# Patient Record
Sex: Female | Born: 1935
Health system: Southern US, Community
[De-identification: ages and names within clinical notes are randomized; demographics above are authoritative.]

## PROBLEM LIST (undated history)

## (undated) DIAGNOSIS — I4892 Unspecified atrial flutter: Principal | ICD-10-CM

## (undated) DIAGNOSIS — M549 Dorsalgia, unspecified: Secondary | ICD-10-CM

## (undated) DIAGNOSIS — I4891 Unspecified atrial fibrillation: Secondary | ICD-10-CM

## (undated) HISTORY — PX: BACK SURGERY: SHX140

## (undated) HISTORY — DX: Unspecified atrial fibrillation: I48.91

## (undated) HISTORY — DX: Dorsalgia, unspecified: M54.9

## (undated) HISTORY — PX: TOTAL ABDOMINAL HYSTERECTOMY W/ BILATERAL SALPINGOOPHORECTOMY: SHX83

## (undated) HISTORY — PX: BREAST BIOPSY: SHX20

## (undated) HISTORY — DX: Unspecified atrial flutter: I48.92

---

## 2008-06-18 ENCOUNTER — Encounter: Admission: RE | Admit: 2008-06-18 | Discharge: 2008-06-18 | Payer: Self-pay | Admitting: Internal Medicine

## 2008-07-02 ENCOUNTER — Encounter: Admission: RE | Admit: 2008-07-02 | Discharge: 2008-07-02 | Payer: Self-pay | Admitting: Internal Medicine

## 2008-09-14 ENCOUNTER — Encounter: Admission: RE | Admit: 2008-09-14 | Discharge: 2008-09-14 | Payer: Self-pay | Admitting: Internal Medicine

## 2008-12-21 ENCOUNTER — Encounter: Admission: RE | Admit: 2008-12-21 | Discharge: 2008-12-21 | Payer: Self-pay | Admitting: Internal Medicine

## 2009-01-04 ENCOUNTER — Encounter: Admission: RE | Admit: 2009-01-04 | Discharge: 2009-01-04 | Payer: Self-pay | Admitting: Internal Medicine

## 2009-01-27 ENCOUNTER — Encounter: Admission: RE | Admit: 2009-01-27 | Discharge: 2009-01-27 | Payer: Self-pay | Admitting: Internal Medicine

## 2011-08-29 ENCOUNTER — Encounter: Payer: Self-pay | Admitting: Physician Assistant

## 2011-08-30 ENCOUNTER — Encounter: Payer: Self-pay | Admitting: Cardiology

## 2011-08-30 ENCOUNTER — Encounter: Payer: Self-pay | Admitting: Physician Assistant

## 2011-08-30 DIAGNOSIS — I471 Supraventricular tachycardia: Secondary | ICD-10-CM

## 2011-09-08 ENCOUNTER — Encounter: Payer: Self-pay | Admitting: Cardiology

## 2011-09-11 ENCOUNTER — Encounter: Payer: Self-pay | Admitting: Cardiology

## 2011-09-11 ENCOUNTER — Ambulatory Visit (INDEPENDENT_AMBULATORY_CARE_PROVIDER_SITE_OTHER): Payer: Medicare Other | Admitting: Cardiology

## 2011-09-11 VITALS — BP 144/70 | HR 88 | Ht 62.0 in | Wt 145.4 lb

## 2011-09-11 DIAGNOSIS — I498 Other specified cardiac arrhythmias: Secondary | ICD-10-CM

## 2011-09-11 DIAGNOSIS — I4891 Unspecified atrial fibrillation: Secondary | ICD-10-CM | POA: Insufficient documentation

## 2011-09-11 DIAGNOSIS — I471 Supraventricular tachycardia: Secondary | ICD-10-CM

## 2011-09-11 NOTE — Progress Notes (Signed)
Patient ID: Meredith Bennett, female   DOB: Jul 25, 1918, 78 y.o.   MRN: [id number] PCP: Dr. Sherril Croon  78 yo presents for hospital followup.  She was admitted earlier this month with atrial fibrillation/RVR.  She felt her heart racing so went to her PCP.   She was noted to be in atrial fibrillation with RVR.  She was sent to [facility] and admitted.  She went back into NSR in the hospital after getting diltiazem.  She is in NSR today and has not noted tachypalpitations since discharge.   No definite trigger for the atrial fibrillation episode: she had been having a lot of hip pain, however, prior to the episode.  No syncope, lightheadedness, or falls.  She does have mild gait instability and has a cane.  No chest pain or DOE.  She has not started Xarelto yet as she was waiting to get it at a discount.  She continues to drive and lives alone.   ECG: NSR, normal  Labs (7/13): Creatinine 0.73, hemoglobin 15.2, BNP 104  PMH: 1. Back surgery x 2 2. Hip pain 3. TAH/BSO 4. Paroxysmal atrial fibrillation: Echo (7/13) with EF 60-65%, moderate diastolic dysfunction, mild MR, normal RV size and systolic function.  5. Carotid dopplers (7/13) were normal  SH: Lives alone, widow.  1 son in [place].  Nonsmoker.   FH: Mother died of "old age" at 75  ROS: All systems reviewed and negative except as per HPI.   Current Outpatient Prescriptions  Medication Sig Dispense Refill  . diltiazem (CARDIZEM) 120 MG tablet Take 120 mg by mouth daily.      . ergocalciferol (VITAMIN D2) 50000 UNITS capsule Take 50,000 Units by mouth once a week.      Marland Kitchen HYDROcodone-acetaminophen (VICODIN) 5-500 MG per tablet Take 1 tablet by mouth as needed.       . Multiple Vitamins-Minerals (EYE VITAMINS PO) Take 1 capsule by mouth 2 (two) times daily.      . Rivaroxaban (XARELTO) 15 MG TABS tablet Take 15 mg by mouth daily.        BP 144/70  Pulse 88  Ht 5\' 2"  (1.575 m)  Wt 145 lb 6.4 oz (65.953 kg)  BMI 26.59 kg/m2 General: NAD,  elderly Neck: No JVD, no thyromegaly or thyroid nodule.  Lungs: Clear to auscultation bilaterally with normal respiratory effort. CV: Nondisplaced PMI.  Heart regular S1/S2, no S3/S4, no murmur.  No peripheral edema.  Lower leg varicosities.  No carotid bruit.  Normal pedal pulses.  Abdomen: Soft, nontender, no hepatosplenomegaly, no distention.  Neurologic: Alert and oriented x 3.  Psych: Normal affect. Extremities: No clubbing or cyanosis.

## 2011-09-11 NOTE — Assessment & Plan Note (Signed)
Paroxysmal atrial fibrillation.  Echo with preserved EF.  She is now in NSR.  CHADSVASC = 3 (age 78, gender 1).  She will start Xarelto today (pharmacy has the discounted medication in for her now).  I will have her take 15 mg daily rather than 20 given her age.  Her creatinine is 0.73.  Continue diltiazem CD 120 mg daily.  I will have her get a BMET and CBC in 3 months and followup with me at that time.

## 2011-09-11 NOTE — Patient Instructions (Addendum)
Your physician recommends that you schedule a follow-up appointment in: 3 months with Dr. Shirlee Latch. You will receive a reminder letter in the mail in about 1-2 months reminding you to call and schedule your appointment. If you don't receive this letter, please contact our office. Your physician recommends that you continue on your current medications as directed. Please refer to the Current Medication list given to you today.  Your physician recommends that you return for lab work in: BMET & CBC in 3 months around October 22nd 2013 at [facility].

## 2011-12-04 ENCOUNTER — Encounter: Payer: Self-pay | Admitting: Cardiology

## 2011-12-04 ENCOUNTER — Ambulatory Visit (INDEPENDENT_AMBULATORY_CARE_PROVIDER_SITE_OTHER): Payer: Medicare Other | Admitting: Cardiology

## 2011-12-04 VITALS — BP 133/75 | HR 87 | Ht 62.0 in | Wt 153.0 lb

## 2011-12-04 DIAGNOSIS — I4891 Unspecified atrial fibrillation: Secondary | ICD-10-CM

## 2011-12-04 NOTE — Progress Notes (Signed)
Patient ID: Meredith Bennett, female   DOB: November 21, 1918, 78 y.o.   MRN: [id number] PCP: Dr. Sherril Croon  78 yo presents for for cardiology followup.  She was admitted in 7/13 with atrial fibrillation/RVR.  She felt her heart racing so went to her PCP.   She was noted to be in atrial fibrillation with RVR.  She was sent to [facility] and admitted.  She went back into NSR in the hospital after getting diltiazem.  She is in NSR today and has not noted tachypalpitations since discharge.  She is taking Xarelto with no problems.  No syncope, lightheadedness, or falls.  She does have mild gait instability and has a cane.  No chest pain or DOE.  She continues to drive and lives alone.  She has noted some pedal edema since starting diltiazem.  Main limitation continues to be low back and hip pain.   ECG: NSR, normal  Labs (7/13): Creatinine 0.73, hemoglobin 15.2, BNP 104  PMH: 1. Back surgery x 2 2. Hip pain 3. TAH/BSO 4. Paroxysmal atrial fibrillation: Echo (7/13) with EF 60-65%, moderate diastolic dysfunction, mild MR, normal RV size and systolic function.  5. Carotid dopplers (7/13) were normal  SH: Lives alone, widow.  1 son in [place].  Nonsmoker.   FH: Mother died of "old age" at 33  Current Outpatient Prescriptions  Medication Sig Dispense Refill  . diltiazem (CARDIZEM) 120 MG tablet Take 120 mg by mouth daily.      . ergocalciferol (VITAMIN D2) 50000 UNITS capsule Take 50,000 Units by mouth once a week.      . Multiple Vitamins-Minerals (EYE VITAMINS PO) Take 1 capsule by mouth 2 (two) times daily.      . Rivaroxaban (XARELTO) 15 MG TABS tablet Take 15 mg by mouth daily.        BP 133/75  Pulse 87  Ht 5\' 2"  (1.575 m)  Wt 153 lb (69.4 kg)  BMI 27.98 kg/m2 General: NAD, elderly Neck: No JVD, no thyromegaly or thyroid nodule.  Lungs: Very slight crackles at bases bilaterally.  CV: Nondisplaced PMI.  Heart regular S1/S2, no S3/S4, no murmur.  1+ pedal edema bilaterally.  Lower leg varicosities.   No carotid bruit.  Normal pedal pulses.  Abdomen: Soft, nontender, no hepatosplenomegaly, no distention.  Neurologic: Alert and oriented x 3.  Psych: Normal affect. Extremities: No clubbing or cyanosis.   Assessment/Plan:  1. Atrial fibrillation: She is in NSR today.  No symptomatic recurrence.  Doing well on lower dose Xarelto with no falls or bleeding episodes.  Continue Xarelto and diltiazem CD at current doses.  Check BMET/CBC.  2. Pedal edema: Possibly due to diltiazem.  It is relatively mild and does not bother her.  I offered to change her rate control med to Toprol XL but she is comfortable just continuing diltiazem for now.  If edema worsens, would switch to beta blocker.   [name] 12/04/2011 3:23 PM

## 2011-12-04 NOTE — Patient Instructions (Signed)
Continue all current medications. Your physician wants you to follow up in: 6 months.  You will receive a reminder letter in the mail one-two months in advance.  If you don't receive a letter, please call our office to schedule the follow up appointment   

## 2011-12-08 ENCOUNTER — Telehealth: Payer: Self-pay | Admitting: *Deleted

## 2011-12-08 NOTE — Telephone Encounter (Signed)
Message copied by Eustace Moore on Fri Dec 08, 2011 11:18 AM ------      Message from: Laurey Morale      Created: Wed Dec 06, 2011 10:11 AM       Labs ok

## 2011-12-08 NOTE — Telephone Encounter (Signed)
Patient informed. 

## 2012-06-14 ENCOUNTER — Ambulatory Visit: Payer: Medicare Other | Admitting: Cardiology

## 2012-07-03 ENCOUNTER — Ambulatory Visit: Payer: Medicare Other | Admitting: Cardiology

## 2012-07-22 ENCOUNTER — Encounter: Payer: Self-pay | Admitting: Cardiology

## 2012-07-22 ENCOUNTER — Ambulatory Visit (INDEPENDENT_AMBULATORY_CARE_PROVIDER_SITE_OTHER): Payer: Medicare Other | Admitting: Cardiology

## 2012-07-22 VITALS — BP 120/67 | HR 78 | Ht 62.0 in | Wt 157.0 lb

## 2012-07-22 DIAGNOSIS — Z79899 Other long term (current) drug therapy: Secondary | ICD-10-CM

## 2012-07-22 DIAGNOSIS — I4891 Unspecified atrial fibrillation: Secondary | ICD-10-CM

## 2012-07-22 NOTE — Patient Instructions (Signed)
   Lab for BMET, CBC   Office will contact with result Continue all current medications. Your physician wants you to follow up in: 6 months.  You will receive a reminder letter in the mail one-two months in advance.  If you don't receive a letter, please call our office to schedule the follow up appointment

## 2012-07-23 NOTE — Progress Notes (Signed)
Patient ID: Meredith Bennett, female   DOB: 16-Mar-1918, 79 y.o.   MRN: [id number] PCP: Dr. Sherril Croon  79 yo presents for for cardiology followup.  She was admitted in 7/13 with atrial fibrillation/RVR.  She felt her heart racing so went to her PCP.   She was noted to be in atrial fibrillation with RVR.  She was sent to [facility] and admitted.  She went back into NSR in the hospital after getting diltiazem.  She is in NSR today and has not noted tachypalpitations since discharge.  She is taking Xarelto with no problems, no obvious blood in her stool.  No syncope, lightheadedness, or falls.  She does have mild gait instability and has a cane.  No chest pain or DOE.  She continues to drive and lives alone.  She has noted some pedal edema since starting diltiazem.  Main limitation continues to be low back and hip pain.   ECG: NSR, normal  Labs (7/13): Creatinine 0.73, hemoglobin 15.2, BNP 104 Labs (10/13): K 4, creatinine 0.64, HCT 40.2  PMH: 1. Back surgery x 2 2. Hip pain 3. TAH/BSO 4. Paroxysmal atrial fibrillation: Echo (7/13) with EF 60-65%, moderate diastolic dysfunction, mild MR, normal RV size and systolic function.  5. Carotid dopplers (7/13) were normal  SH: Lives alone, widow.  1 son in [place].  Nonsmoker.   FH: Mother died of "old age" at 30  Current Outpatient Prescriptions  Medication Sig Dispense Refill  . diltiazem (CARDIZEM) 120 MG tablet Take 120 mg by mouth daily.      . ergocalciferol (VITAMIN D2) 50000 UNITS capsule Take 50,000 Units by mouth once a week.      . Multiple Vitamins-Minerals (EYE VITAMINS PO) Take 1 capsule by mouth 2 (two) times daily.      . Rivaroxaban (XARELTO) 15 MG TABS tablet Take 15 mg by mouth daily.       No current facility-administered medications for this visit.    BP 120/67  Pulse 78  Ht 5\' 2"  (1.575 m)  Wt 157 lb (71.215 kg)  BMI 28.71 kg/m2 General: NAD, elderly Neck: No JVD, no thyromegaly or thyroid nodule.  Lungs: Very slight crackles  at bases bilaterally.  CV: Nondisplaced PMI.  Heart regular S1/S2, no S3/S4, no murmur.  1+ pedal edema bilaterally.  Lower leg varicosities.  No carotid bruit.  Normal pedal pulses.  Abdomen: Soft, nontender, no hepatosplenomegaly, no distention.  Neurologic: Alert and oriented x 3.  Psych: Normal affect. Extremities: No clubbing or cyanosis.   Assessment/Plan:  1. Atrial fibrillation: She is in NSR today.  No symptomatic recurrence.  Doing well on lower dose Xarelto with no falls or bleeding episodes.  Continue Xarelto and diltiazem CD at current doses.  Check BMET/CBC.  2. Pedal edema: Possibly due to diltiazem.  It is relatively mild and does not bother her.    [name] 07/23/2012

## 2012-07-24 ENCOUNTER — Encounter: Payer: Self-pay | Admitting: *Deleted

## 2016-02-25 DIAGNOSIS — I10 Essential (primary) hypertension: Secondary | ICD-10-CM | POA: Diagnosis not present

## 2016-02-25 DIAGNOSIS — Z6826 Body mass index (BMI) 26.0-26.9, adult: Secondary | ICD-10-CM | POA: Diagnosis not present

## 2016-02-25 DIAGNOSIS — S52531A Colles' fracture of right radius, initial encounter for closed fracture: Secondary | ICD-10-CM | POA: Diagnosis not present

## 2016-02-25 DIAGNOSIS — Z713 Dietary counseling and surveillance: Secondary | ICD-10-CM | POA: Diagnosis not present

## 2016-02-25 DIAGNOSIS — W010XXA Fall on same level from slipping, tripping and stumbling without subsequent striking against object, initial encounter: Secondary | ICD-10-CM | POA: Diagnosis not present

## 2016-02-25 DIAGNOSIS — Z79899 Other long term (current) drug therapy: Secondary | ICD-10-CM | POA: Diagnosis not present

## 2016-02-25 DIAGNOSIS — Z299 Encounter for prophylactic measures, unspecified: Secondary | ICD-10-CM | POA: Diagnosis not present

## 2016-02-25 DIAGNOSIS — S52611A Displaced fracture of right ulna styloid process, initial encounter for closed fracture: Secondary | ICD-10-CM | POA: Diagnosis not present

## 2016-02-29 DIAGNOSIS — M25531 Pain in right wrist: Secondary | ICD-10-CM | POA: Diagnosis not present

## 2016-02-29 DIAGNOSIS — S52551A Other extraarticular fracture of lower end of right radius, initial encounter for closed fracture: Secondary | ICD-10-CM | POA: Diagnosis not present

## 2016-03-06 DIAGNOSIS — Z713 Dietary counseling and surveillance: Secondary | ICD-10-CM | POA: Diagnosis not present

## 2016-03-06 DIAGNOSIS — I4891 Unspecified atrial fibrillation: Secondary | ICD-10-CM | POA: Diagnosis not present

## 2016-03-06 DIAGNOSIS — Z6826 Body mass index (BMI) 26.0-26.9, adult: Secondary | ICD-10-CM | POA: Diagnosis not present

## 2016-03-06 DIAGNOSIS — Z789 Other specified health status: Secondary | ICD-10-CM | POA: Diagnosis not present

## 2016-03-06 DIAGNOSIS — Z299 Encounter for prophylactic measures, unspecified: Secondary | ICD-10-CM | POA: Diagnosis not present

## 2016-03-06 DIAGNOSIS — J069 Acute upper respiratory infection, unspecified: Secondary | ICD-10-CM | POA: Diagnosis not present

## 2016-03-06 DIAGNOSIS — M545 Low back pain: Secondary | ICD-10-CM | POA: Diagnosis not present

## 2016-03-06 DIAGNOSIS — I10 Essential (primary) hypertension: Secondary | ICD-10-CM | POA: Diagnosis not present

## 2016-03-21 DIAGNOSIS — S52551A Other extraarticular fracture of lower end of right radius, initial encounter for closed fracture: Secondary | ICD-10-CM | POA: Diagnosis not present

## 2016-04-05 DIAGNOSIS — I4891 Unspecified atrial fibrillation: Secondary | ICD-10-CM | POA: Diagnosis not present

## 2016-04-05 DIAGNOSIS — Z299 Encounter for prophylactic measures, unspecified: Secondary | ICD-10-CM | POA: Diagnosis not present

## 2016-04-05 DIAGNOSIS — I10 Essential (primary) hypertension: Secondary | ICD-10-CM | POA: Diagnosis not present

## 2016-04-05 DIAGNOSIS — R600 Localized edema: Secondary | ICD-10-CM | POA: Diagnosis not present

## 2016-04-05 DIAGNOSIS — M545 Low back pain: Secondary | ICD-10-CM | POA: Diagnosis not present

## 2016-04-05 DIAGNOSIS — Z6826 Body mass index (BMI) 26.0-26.9, adult: Secondary | ICD-10-CM | POA: Diagnosis not present

## 2016-04-07 DIAGNOSIS — S52551A Other extraarticular fracture of lower end of right radius, initial encounter for closed fracture: Secondary | ICD-10-CM | POA: Diagnosis not present

## 2016-04-12 DIAGNOSIS — M545 Low back pain: Secondary | ICD-10-CM | POA: Diagnosis not present

## 2016-04-12 DIAGNOSIS — R600 Localized edema: Secondary | ICD-10-CM | POA: Diagnosis not present

## 2016-04-12 DIAGNOSIS — H9113 Presbycusis, bilateral: Secondary | ICD-10-CM | POA: Diagnosis not present

## 2016-04-12 DIAGNOSIS — I4891 Unspecified atrial fibrillation: Secondary | ICD-10-CM | POA: Diagnosis not present

## 2016-04-12 DIAGNOSIS — H547 Unspecified visual loss: Secondary | ICD-10-CM | POA: Diagnosis not present

## 2016-04-12 DIAGNOSIS — Z9181 History of falling: Secondary | ICD-10-CM | POA: Diagnosis not present

## 2016-04-12 DIAGNOSIS — Z0000 Encounter for general adult medical examination without abnormal findings: Secondary | ICD-10-CM | POA: Diagnosis not present

## 2016-04-13 DIAGNOSIS — I4891 Unspecified atrial fibrillation: Secondary | ICD-10-CM | POA: Diagnosis not present

## 2016-04-13 DIAGNOSIS — I10 Essential (primary) hypertension: Secondary | ICD-10-CM | POA: Diagnosis not present

## 2016-05-12 DIAGNOSIS — I4891 Unspecified atrial fibrillation: Secondary | ICD-10-CM | POA: Diagnosis not present

## 2016-05-12 DIAGNOSIS — I10 Essential (primary) hypertension: Secondary | ICD-10-CM | POA: Diagnosis not present

## 2016-06-06 DIAGNOSIS — I10 Essential (primary) hypertension: Secondary | ICD-10-CM | POA: Diagnosis not present

## 2016-06-06 DIAGNOSIS — I4891 Unspecified atrial fibrillation: Secondary | ICD-10-CM | POA: Diagnosis not present

## 2016-06-06 DIAGNOSIS — K219 Gastro-esophageal reflux disease without esophagitis: Secondary | ICD-10-CM | POA: Diagnosis not present

## 2016-06-06 DIAGNOSIS — Z299 Encounter for prophylactic measures, unspecified: Secondary | ICD-10-CM | POA: Diagnosis not present

## 2016-06-06 DIAGNOSIS — Z713 Dietary counseling and surveillance: Secondary | ICD-10-CM | POA: Diagnosis not present

## 2016-06-06 DIAGNOSIS — M545 Low back pain: Secondary | ICD-10-CM | POA: Diagnosis not present

## 2016-06-06 DIAGNOSIS — M810 Age-related osteoporosis without current pathological fracture: Secondary | ICD-10-CM | POA: Diagnosis not present

## 2016-06-12 DIAGNOSIS — I10 Essential (primary) hypertension: Secondary | ICD-10-CM | POA: Diagnosis not present

## 2016-06-12 DIAGNOSIS — I4891 Unspecified atrial fibrillation: Secondary | ICD-10-CM | POA: Diagnosis not present

## 2016-07-11 DIAGNOSIS — I4891 Unspecified atrial fibrillation: Secondary | ICD-10-CM | POA: Diagnosis not present

## 2016-07-11 DIAGNOSIS — I10 Essential (primary) hypertension: Secondary | ICD-10-CM | POA: Diagnosis not present

## 2016-08-17 DIAGNOSIS — Z0000 Encounter for general adult medical examination without abnormal findings: Secondary | ICD-10-CM | POA: Diagnosis not present

## 2016-08-17 DIAGNOSIS — I4891 Unspecified atrial fibrillation: Secondary | ICD-10-CM | POA: Diagnosis not present

## 2016-08-17 DIAGNOSIS — Z1389 Encounter for screening for other disorder: Secondary | ICD-10-CM | POA: Diagnosis not present

## 2016-08-17 DIAGNOSIS — Z6825 Body mass index (BMI) 25.0-25.9, adult: Secondary | ICD-10-CM | POA: Diagnosis not present

## 2016-08-17 DIAGNOSIS — I10 Essential (primary) hypertension: Secondary | ICD-10-CM | POA: Diagnosis not present

## 2016-08-17 DIAGNOSIS — Z7189 Other specified counseling: Secondary | ICD-10-CM | POA: Diagnosis not present

## 2016-08-17 DIAGNOSIS — R5383 Other fatigue: Secondary | ICD-10-CM | POA: Diagnosis not present

## 2016-08-17 DIAGNOSIS — Z299 Encounter for prophylactic measures, unspecified: Secondary | ICD-10-CM | POA: Diagnosis not present

## 2016-08-17 DIAGNOSIS — Z79899 Other long term (current) drug therapy: Secondary | ICD-10-CM | POA: Diagnosis not present

## 2016-08-17 DIAGNOSIS — I471 Supraventricular tachycardia: Secondary | ICD-10-CM | POA: Diagnosis not present

## 2016-09-15 DIAGNOSIS — I4891 Unspecified atrial fibrillation: Secondary | ICD-10-CM | POA: Diagnosis not present

## 2016-09-15 DIAGNOSIS — I10 Essential (primary) hypertension: Secondary | ICD-10-CM | POA: Diagnosis not present

## 2016-10-16 DIAGNOSIS — H353110 Nonexudative age-related macular degeneration, right eye, stage unspecified: Secondary | ICD-10-CM | POA: Diagnosis not present

## 2016-11-07 DIAGNOSIS — I4891 Unspecified atrial fibrillation: Secondary | ICD-10-CM | POA: Diagnosis not present

## 2016-11-07 DIAGNOSIS — I10 Essential (primary) hypertension: Secondary | ICD-10-CM | POA: Diagnosis not present

## 2016-11-18 DIAGNOSIS — R69 Illness, unspecified: Secondary | ICD-10-CM | POA: Diagnosis not present

## 2016-12-12 DIAGNOSIS — J069 Acute upper respiratory infection, unspecified: Secondary | ICD-10-CM | POA: Diagnosis not present

## 2016-12-12 DIAGNOSIS — Z6824 Body mass index (BMI) 24.0-24.9, adult: Secondary | ICD-10-CM | POA: Diagnosis not present

## 2016-12-12 DIAGNOSIS — I10 Essential (primary) hypertension: Secondary | ICD-10-CM | POA: Diagnosis not present

## 2016-12-12 DIAGNOSIS — I4891 Unspecified atrial fibrillation: Secondary | ICD-10-CM | POA: Diagnosis not present

## 2016-12-12 DIAGNOSIS — Z299 Encounter for prophylactic measures, unspecified: Secondary | ICD-10-CM | POA: Diagnosis not present

## 2017-01-19 DIAGNOSIS — Z6825 Body mass index (BMI) 25.0-25.9, adult: Secondary | ICD-10-CM | POA: Diagnosis not present

## 2017-01-19 DIAGNOSIS — I4891 Unspecified atrial fibrillation: Secondary | ICD-10-CM | POA: Diagnosis not present

## 2017-01-19 DIAGNOSIS — I10 Essential (primary) hypertension: Secondary | ICD-10-CM | POA: Diagnosis not present

## 2017-01-19 DIAGNOSIS — Z299 Encounter for prophylactic measures, unspecified: Secondary | ICD-10-CM | POA: Diagnosis not present

## 2017-01-19 DIAGNOSIS — M810 Age-related osteoporosis without current pathological fracture: Secondary | ICD-10-CM | POA: Diagnosis not present

## 2017-01-19 DIAGNOSIS — J309 Allergic rhinitis, unspecified: Secondary | ICD-10-CM | POA: Diagnosis not present

## 2017-03-01 DIAGNOSIS — I4891 Unspecified atrial fibrillation: Secondary | ICD-10-CM | POA: Diagnosis not present

## 2017-03-01 DIAGNOSIS — R531 Weakness: Secondary | ICD-10-CM | POA: Diagnosis not present

## 2017-03-01 DIAGNOSIS — I10 Essential (primary) hypertension: Secondary | ICD-10-CM | POA: Diagnosis not present

## 2017-03-01 DIAGNOSIS — R000 Tachycardia, unspecified: Secondary | ICD-10-CM | POA: Diagnosis not present

## 2017-03-01 DIAGNOSIS — J42 Unspecified chronic bronchitis: Secondary | ICD-10-CM | POA: Diagnosis not present

## 2017-03-01 DIAGNOSIS — M810 Age-related osteoporosis without current pathological fracture: Secondary | ICD-10-CM | POA: Diagnosis not present

## 2017-03-01 DIAGNOSIS — Z79899 Other long term (current) drug therapy: Secondary | ICD-10-CM | POA: Diagnosis not present

## 2017-03-01 DIAGNOSIS — Z87440 Personal history of urinary (tract) infections: Secondary | ICD-10-CM | POA: Diagnosis not present

## 2017-03-01 DIAGNOSIS — Z299 Encounter for prophylactic measures, unspecified: Secondary | ICD-10-CM | POA: Diagnosis not present

## 2017-03-01 DIAGNOSIS — Z6826 Body mass index (BMI) 26.0-26.9, adult: Secondary | ICD-10-CM | POA: Diagnosis not present

## 2017-03-02 ENCOUNTER — Emergency Department (HOSPITAL_COMMUNITY)
Admission: EM | Admit: 2017-03-02 | Discharge: 2017-03-02 | Disposition: A | Discharge status: Home / Self Care | Payer: Medicare HMO | Attending: Emergency Medicine | Admitting: Emergency Medicine

## 2017-03-02 ENCOUNTER — Emergency Department (HOSPITAL_COMMUNITY): Payer: Medicare HMO

## 2017-03-02 ENCOUNTER — Encounter (HOSPITAL_COMMUNITY): Payer: Self-pay | Admitting: Emergency Medicine

## 2017-03-02 ENCOUNTER — Other Ambulatory Visit: Payer: Self-pay

## 2017-03-02 DIAGNOSIS — Z79899 Other long term (current) drug therapy: Secondary | ICD-10-CM | POA: Diagnosis not present

## 2017-03-02 DIAGNOSIS — I10 Essential (primary) hypertension: Secondary | ICD-10-CM | POA: Insufficient documentation

## 2017-03-02 DIAGNOSIS — R002 Palpitations: Secondary | ICD-10-CM | POA: Diagnosis present

## 2017-03-02 DIAGNOSIS — Z880 Allergy status to penicillin: Secondary | ICD-10-CM | POA: Diagnosis not present

## 2017-03-02 DIAGNOSIS — R000 Tachycardia, unspecified: Secondary | ICD-10-CM | POA: Diagnosis not present

## 2017-03-02 DIAGNOSIS — Z7901 Long term (current) use of anticoagulants: Secondary | ICD-10-CM | POA: Insufficient documentation

## 2017-03-02 DIAGNOSIS — R531 Weakness: Secondary | ICD-10-CM | POA: Diagnosis not present

## 2017-03-02 DIAGNOSIS — R0789 Other chest pain: Secondary | ICD-10-CM | POA: Diagnosis not present

## 2017-03-02 DIAGNOSIS — I4891 Unspecified atrial fibrillation: Secondary | ICD-10-CM | POA: Insufficient documentation

## 2017-03-02 DIAGNOSIS — Z8679 Personal history of other diseases of the circulatory system: Secondary | ICD-10-CM | POA: Insufficient documentation

## 2017-03-02 LAB — BASIC METABOLIC PANEL
Anion gap: 9 (ref 5–15)
BUN: 21 mg/dL — AB (ref 6–20)
CHLORIDE: 103 mmol/L (ref 101–111)
CO2: 26 mmol/L (ref 22–32)
Calcium: 9.5 mg/dL (ref 8.9–10.3)
Creatinine, Ser: 0.97 mg/dL (ref 0.44–1.00)
GFR calc Af Amer: 55 mL/min — ABNORMAL LOW (ref >60)
GFR calc non Af Amer: 48 mL/min — ABNORMAL LOW (ref >60)
Glucose, Bld: 172 mg/dL — ABNORMAL HIGH (ref 65–99)
POTASSIUM: 3.9 mmol/L (ref 3.5–5.1)
SODIUM: 138 mmol/L (ref 135–145)

## 2017-03-02 LAB — CBC
HEMATOCRIT: 43.4 % (ref 36.0–46.0)
HEMOGLOBIN: 14.1 g/dL (ref 12.0–15.0)
MCH: 29.3 pg (ref 26.0–34.0)
MCHC: 32.5 g/dL (ref 30.0–36.0)
MCV: 90.2 fL (ref 78.0–100.0)
Platelets: 242 10*3/uL (ref 150–400)
RBC: 4.81 MIL/uL (ref 3.87–5.11)
RDW: 13.6 % (ref 11.5–15.5)
WBC: 8.1 10*3/uL (ref 4.0–10.5)

## 2017-03-02 LAB — MAGNESIUM: MAGNESIUM: 2.1 mg/dL (ref 1.7–2.4)

## 2017-03-02 LAB — TROPONIN I: Troponin I: <0.03 ng/mL (ref <0.03)

## 2017-03-02 MED ORDER — DILTIAZEM HCL-DEXTROSE 100-5 MG/100ML-% IV SOLN (PREMIX)
5.0000 mg/h | INTRAVENOUS | Status: DC
  Filled 2017-03-02: qty 100

## 2017-03-02 MED ORDER — DILTIAZEM LOAD VIA INFUSION
5.0000 mg | Freq: Once | INTRAVENOUS | Status: DC
  Filled 2017-03-02: qty 5

## 2017-03-02 NOTE — ED Provider Notes (Signed)
[facility] EMERGENCY DEPARTMENT Provider Note   CSN: [id number] Arrival date & time: 03/02/17  1853     History   Chief Complaint Chief Complaint  Patient presents with  . Tachycardia    HPI Meredith Bennett is a 84 y.o. female.  HPI  Presents on the same day of discharge from another facility now with concern for palpitations, mild chest discomfort. Patient has a history of atrial fibrillation. She takes diltiazem. Today, after taking her regular medicines and then a second dose due to persistency of symptoms she was sent here for evaluation. Notably, the patient was admitted yesterday to a different facility after similar symptoms that began 2 days prior. She notes that overnight she received multiple IV medications, but on discharge was not sent home with any new medication. Though she is unsure of the exact medication, between her and family members description it seems as though she has been taking Cardizem for rate control. Beyond A. fib she states that she is generally healthy, active, is a former [employer]. She denies other complaints including dyspnea, syncope, fever, chills.   Past Medical History:  Diagnosis Date  . Back pain, lumbosacral   . DJD (degenerative joint disease), lumbosacral    MRI   . HTN (hypertension)   . SVT (supraventricular tachycardia) [facility])     Patient Active Problem List   Diagnosis Date Noted  . Atrial fibrillation ([place]) 09/11/2011    Past Surgical History:  Procedure Laterality Date  . BACK SURGERY     April 2011 and November 2011  . BREAST BIOPSY    . total abdominal hysterectomy and bilateral salpingo - oophorectomy      OB History    No data available       Home Medications    Prior to Admission medications   Medication Sig Start Date End Date Taking? Authorizing Provider  diltiazem (CARDIZEM) 120 MG tablet Take 120 mg by mouth daily.    [provider]  ergocalciferol (VITAMIN D2) 50000 UNITS capsule Take  50,000 Units by mouth once a week.    [provider]  Multiple Vitamins-Minerals (EYE VITAMINS PO) Take 1 capsule by mouth 2 (two) times daily.    [provider]  Rivaroxaban (XARELTO) 15 MG TABS tablet Take 15 mg by mouth daily.    [provider]    Family History No family history on file.  Social History Social History   Tobacco Use  . Smoking status: Never Smoker  . Smokeless tobacco: Never Used  Substance Use Topics  . Alcohol use: No  . Drug use: No     Allergies   Fosamax [alendronate sodium] and Penicillins   Review of Systems Review of Systems  Constitutional:       Per HPI, otherwise negative  HENT:       Per HPI, otherwise negative  Respiratory:       Per HPI, otherwise negative  Cardiovascular:       Per HPI, otherwise negative  Gastrointestinal: Negative for vomiting.  Endocrine:       Negative aside from HPI  Genitourinary:       Neg aside from HPI   Musculoskeletal:       Per HPI, otherwise negative  Skin: Negative.   Neurological: Negative for syncope.     Physical Exam Updated Vital Signs BP 114/74   Pulse (!) 160   Temp (!) 97.5 F (36.4 C) (Oral)   Resp (!) 22   Ht 5'  2" (1.575 m)   Wt 63.5 kg (140 lb)   SpO2 95%   BMI 25.61 kg/m   Physical Exam  Constitutional: She is oriented to person, place, and time. She appears well-developed and well-nourished. No distress.  HENT:  Head: Normocephalic and atraumatic.  Eyes: Conjunctivae and EOM are normal.  Cardiovascular: An irregularly irregular rhythm present. Tachycardia present.  Pulmonary/Chest: Effort normal and breath sounds normal. No stridor. No respiratory distress.  Abdominal: She exhibits no distension.  Musculoskeletal: She exhibits no edema.  Neurological: She is alert and oriented to person, place, and time. No cranial nerve deficit.  Skin: Skin is warm and dry.  Psychiatric: She has a normal mood and affect.  Nursing note and vitals  reviewed.    ED Treatments / Results  Labs (all labs ordered are listed, but only abnormal results are displayed) Labs Reviewed  BASIC METABOLIC PANEL  MAGNESIUM  CBC  TROPONIN I    EKG  EKG Interpretation  Date/Time:  Friday March 02 2017 19:12:56 EST Ventricular Rate:  159 PR Interval:    QRS Duration: 109 QT Interval:  284 QTC Calculation: 462 R Axis:   165 Text Interpretation:  Atrial fibrillation Abnormal ekg Confirmed by Carmin Muskrat [phone]) on 03/02/2017 7:47:46 PM       Radiology No results found.  Procedures Procedures (including critical care time)  Medications Ordered in ED Medications  diltiazem (CARDIZEM) 1 mg/mL load via infusion 5 mg (not administered)    And  diltiazem (CARDIZEM) 100 mg in dextrose 5% 177mL (1 mg/mL) infusion (not administered)     Initial Impression / Assessment and Plan / ED Course  I have reviewed the triage vital signs and the nursing notes.  Pertinent labs & imaging results that were available during my care of the patient were reviewed by me and considered in my medical decision making (see chart for details).    CHA2DS2/VAS Stroke Risk Points      3 >= 2 Points: High Risk  1 - 1.99 Points: Medium Risk  0 Points: Low Risk    This is the only CHA2DS2/VAS Stroke Risk Points available for the past  year.:  Change: N/A     Details    This score determines the patient's risk of having a stroke if the  patient has atrial fibrillation.       Points Metrics  0 Has Congestive Heart Failure:  No   0 Has Vascular Disease:  No   0 Has Hypertension:  No   2 Age:  30   0 Has Diabetes:  No   0 Had Stroke:  No  Had TIA:  No  Had thromboembolism:  No   1 Female:  Yes       After the initial evaluation with concern for persistent atrial fibrillation the patient will receive Cardizem IV  bolus and drip. Patient is not anticoagulated, making her not a candidate for cardioversion. In addition, the patient's symptoms seem  to have begun at least 72 hours ago.    8:39 PM Patient's A. fib has resolved, just prior to receiving her diltiazem IV. Labs unremarkable, and she states she feels better. Given his resolution, with unremarkable labs, now unremarkable vital signs, the patient will follow up with cardiology in the clinic, ambulatory clinic referral has been made.  Final Clinical Impressions(s) / ED Diagnoses  Atrial fibrillation   Carmin Muskrat, MD 03/02/17 2039

## 2017-03-02 NOTE — ED Triage Notes (Signed)
Discharged from [place] this afternoon havin been admitted for a fib  Came home felt heart racing  Came here for eval

## 2017-03-02 NOTE — Discharge Instructions (Signed)
As discussed, your evaluation today has been largely reassuring.  But, it is important that you monitor your condition carefully, and do not hesitate to return to the ED if you develop new, or concerning changes in your condition. ? ?Otherwise, please follow-up with your physician for appropriate ongoing care. ? ?

## 2017-03-05 DIAGNOSIS — Z9181 History of falling: Secondary | ICD-10-CM | POA: Diagnosis not present

## 2017-03-05 DIAGNOSIS — M810 Age-related osteoporosis without current pathological fracture: Secondary | ICD-10-CM | POA: Diagnosis not present

## 2017-03-05 DIAGNOSIS — M1990 Unspecified osteoarthritis, unspecified site: Secondary | ICD-10-CM | POA: Diagnosis not present

## 2017-03-05 DIAGNOSIS — Z79899 Other long term (current) drug therapy: Secondary | ICD-10-CM | POA: Diagnosis not present

## 2017-03-05 DIAGNOSIS — I481 Persistent atrial fibrillation: Secondary | ICD-10-CM | POA: Diagnosis not present

## 2017-03-05 DIAGNOSIS — Z87440 Personal history of urinary (tract) infections: Secondary | ICD-10-CM | POA: Diagnosis not present

## 2017-03-05 DIAGNOSIS — I10 Essential (primary) hypertension: Secondary | ICD-10-CM | POA: Diagnosis not present

## 2017-03-06 DIAGNOSIS — Z87440 Personal history of urinary (tract) infections: Secondary | ICD-10-CM | POA: Diagnosis not present

## 2017-03-06 DIAGNOSIS — I10 Essential (primary) hypertension: Secondary | ICD-10-CM | POA: Diagnosis not present

## 2017-03-06 DIAGNOSIS — I481 Persistent atrial fibrillation: Secondary | ICD-10-CM | POA: Diagnosis not present

## 2017-03-06 DIAGNOSIS — Z9181 History of falling: Secondary | ICD-10-CM | POA: Diagnosis not present

## 2017-03-06 DIAGNOSIS — M810 Age-related osteoporosis without current pathological fracture: Secondary | ICD-10-CM | POA: Diagnosis not present

## 2017-03-06 DIAGNOSIS — M1990 Unspecified osteoarthritis, unspecified site: Secondary | ICD-10-CM | POA: Diagnosis not present

## 2017-03-06 DIAGNOSIS — Z79899 Other long term (current) drug therapy: Secondary | ICD-10-CM | POA: Diagnosis not present

## 2017-03-08 DIAGNOSIS — Z79899 Other long term (current) drug therapy: Secondary | ICD-10-CM | POA: Diagnosis not present

## 2017-03-08 DIAGNOSIS — Z87440 Personal history of urinary (tract) infections: Secondary | ICD-10-CM | POA: Diagnosis not present

## 2017-03-08 DIAGNOSIS — M1990 Unspecified osteoarthritis, unspecified site: Secondary | ICD-10-CM | POA: Diagnosis not present

## 2017-03-08 DIAGNOSIS — I481 Persistent atrial fibrillation: Secondary | ICD-10-CM | POA: Diagnosis not present

## 2017-03-08 DIAGNOSIS — Z9181 History of falling: Secondary | ICD-10-CM | POA: Diagnosis not present

## 2017-03-08 DIAGNOSIS — I10 Essential (primary) hypertension: Secondary | ICD-10-CM | POA: Diagnosis not present

## 2017-03-08 DIAGNOSIS — M810 Age-related osteoporosis without current pathological fracture: Secondary | ICD-10-CM | POA: Diagnosis not present

## 2017-03-09 DIAGNOSIS — I481 Persistent atrial fibrillation: Secondary | ICD-10-CM | POA: Diagnosis not present

## 2017-03-09 DIAGNOSIS — I4891 Unspecified atrial fibrillation: Secondary | ICD-10-CM | POA: Diagnosis not present

## 2017-03-09 DIAGNOSIS — I10 Essential (primary) hypertension: Secondary | ICD-10-CM | POA: Diagnosis not present

## 2017-03-09 DIAGNOSIS — Z87440 Personal history of urinary (tract) infections: Secondary | ICD-10-CM | POA: Diagnosis not present

## 2017-03-09 DIAGNOSIS — Z299 Encounter for prophylactic measures, unspecified: Secondary | ICD-10-CM | POA: Diagnosis not present

## 2017-03-09 DIAGNOSIS — M1990 Unspecified osteoarthritis, unspecified site: Secondary | ICD-10-CM | POA: Diagnosis not present

## 2017-03-09 DIAGNOSIS — Z6825 Body mass index (BMI) 25.0-25.9, adult: Secondary | ICD-10-CM | POA: Diagnosis not present

## 2017-03-09 DIAGNOSIS — I471 Supraventricular tachycardia: Secondary | ICD-10-CM | POA: Diagnosis not present

## 2017-03-09 DIAGNOSIS — M810 Age-related osteoporosis without current pathological fracture: Secondary | ICD-10-CM | POA: Diagnosis not present

## 2017-03-09 DIAGNOSIS — Z9181 History of falling: Secondary | ICD-10-CM | POA: Diagnosis not present

## 2017-03-09 DIAGNOSIS — Z79899 Other long term (current) drug therapy: Secondary | ICD-10-CM | POA: Diagnosis not present

## 2017-03-12 DIAGNOSIS — Z87440 Personal history of urinary (tract) infections: Secondary | ICD-10-CM | POA: Diagnosis not present

## 2017-03-12 DIAGNOSIS — I481 Persistent atrial fibrillation: Secondary | ICD-10-CM | POA: Diagnosis not present

## 2017-03-12 DIAGNOSIS — I10 Essential (primary) hypertension: Secondary | ICD-10-CM | POA: Diagnosis not present

## 2017-03-12 DIAGNOSIS — Z79899 Other long term (current) drug therapy: Secondary | ICD-10-CM | POA: Diagnosis not present

## 2017-03-12 DIAGNOSIS — Z9181 History of falling: Secondary | ICD-10-CM | POA: Diagnosis not present

## 2017-03-12 DIAGNOSIS — M1990 Unspecified osteoarthritis, unspecified site: Secondary | ICD-10-CM | POA: Diagnosis not present

## 2017-03-12 DIAGNOSIS — M810 Age-related osteoporosis without current pathological fracture: Secondary | ICD-10-CM | POA: Diagnosis not present

## 2017-03-13 ENCOUNTER — Encounter: Payer: Self-pay | Admitting: *Deleted

## 2017-03-13 ENCOUNTER — Encounter: Payer: Self-pay | Admitting: Cardiology

## 2017-03-13 DIAGNOSIS — I481 Persistent atrial fibrillation: Secondary | ICD-10-CM | POA: Diagnosis not present

## 2017-03-13 DIAGNOSIS — Z87440 Personal history of urinary (tract) infections: Secondary | ICD-10-CM | POA: Diagnosis not present

## 2017-03-13 DIAGNOSIS — I10 Essential (primary) hypertension: Secondary | ICD-10-CM | POA: Diagnosis not present

## 2017-03-13 DIAGNOSIS — Z79899 Other long term (current) drug therapy: Secondary | ICD-10-CM | POA: Diagnosis not present

## 2017-03-13 DIAGNOSIS — M1990 Unspecified osteoarthritis, unspecified site: Secondary | ICD-10-CM | POA: Diagnosis not present

## 2017-03-13 DIAGNOSIS — M810 Age-related osteoporosis without current pathological fracture: Secondary | ICD-10-CM | POA: Diagnosis not present

## 2017-03-13 DIAGNOSIS — Z9181 History of falling: Secondary | ICD-10-CM | POA: Diagnosis not present

## 2017-03-13 NOTE — Progress Notes (Signed)
Cardiology Office Note  Date: 03/14/2017   ID: Meredith Bennett, DOB 1918-10-28, MRN [id number]  PCP: Glenda Chroman, MD  Consulting Cardiologist: Rozann Lesches, MD   Chief Complaint  Patient presents with  . Paroxysmal atrial fibrillation    History of Present Illness: Meredith Bennett is a 84 y.o. female referred for cardiology consultation after recent ER visit with Dr. Vanita Panda at [facility]. She was seen at that time with rapid atrial fibrillation. Reportedly, rhythm resolved spontaneously prior to management with IV diltiazem and she was discharged home with regimen including Cardizem CD. She had been seen at [facility] with reportedly similar rhythm the day prior and was managed by Dr.  Woody Seller. She was followed in the past by Dr. Aundra Dubin, last seen in 2014 with known paroxysmal atrial fibrillation. She was previously on Xarelto, although notes from Dr. Woody Seller indicate that anticoagulation was ultimately stopped due to GI bleeding.  She comes in today with a family member, states that she feels well. Since being on Cardizem CD and 180 mg daily recently, she has not had recurring palpitations.  I personally reviewed the recent ECG done during ER visit which is more consistent with an ectopic atrial tachycardia versus atypical atrial flutter.  CHADSVASC score is 3. She recalls having GI bleeding previously when on Xarelto, and is not interested in resuming anticoagulation.  Past Medical History:  Diagnosis Date  . Atrial fibrillation and flutter ([place])    Atrial flutter documented 2013  . Back pain     Past Surgical History:  Procedure Laterality Date  . BACK SURGERY     April 2011 and November 2011  . BREAST BIOPSY    . TOTAL ABDOMINAL HYSTERECTOMY W/ BILATERAL SALPINGOOPHORECTOMY      Current Outpatient Medications  Medication Sig Dispense Refill  . diltiazem (CARDIZEM CD) 180 MG 24 hr capsule Take 180 mg by mouth daily.  0  . ergocalciferol (VITAMIN D2)  50000 UNITS capsule Take 50,000 Units by mouth once a week.    . Multiple Vitamins-Minerals (EYE VITAMINS PO) Take 1 capsule by mouth 2 (two) times daily.     No current facility-administered medications for this visit.    Allergies:  Fosamax [alendronate sodium] and Penicillins   Social History: The patient  reports that  has never smoked. she has never used smokeless tobacco. She reports that she does not drink alcohol or use drugs.   Family History: The patient's family history includes Colon cancer in her father; Hypertension in her mother.   ROS:  Please see the history of present illness. Otherwise, complete review of systems is positive for hearing loss.  All other systems are reviewed and negative.   Physical Exam: VS:  BP 126/64   Pulse 83   Ht 5\' 2"  (1.575 m)   Wt 141 lb 6.4 oz (64.1 kg)   SpO2 96%   BMI 25.86 kg/m , BMI Body mass index is 25.86 kg/m.  Wt Readings from Last 3 Encounters:  03/14/17 141 lb 6.4 oz (64.1 kg)  03/02/17 140 lb (63.5 kg)  07/22/12 157 lb (71.2 kg)    General: Elderly woman, appears comfortable at rest. HEENT: Conjunctiva and lids normal, oropharynx clear. Neck: Supple, no elevated JVP or carotid bruits, no thyromegaly. Lungs: Clear to auscultation, nonlabored breathing at rest. Cardiac: Regular rate and rhythm, no S3. soft systolic murmur. Abdomen: Soft, nontender, bowel sounds present. Extremities: No pitting edema, distal pulses 2+. Skin: Warm and dry. Musculoskeletal:  No kyphosis. Neuropsychiatric: Alert and oriented x3, affect grossly appropriate.  ECG: I personally reviewed the tracing from 03/02/2017 which showed a narrow complex SVT, regular at 159 bpm, consider reentrant mechanism versus atypical flutter with 2:1 block.  Recent Labwork: 03/02/2017: BUN 21; Creatinine, Ser 0.97; Hemoglobin 14.1; Magnesium 2.1; Platelets 242; Potassium 3.9; Sodium 138   Other Studies Reviewed Today:  Echocardiogram 08/30/2011 [facility]): Mild LVH  with LVEF 16-07%, grade 2 diastolic dysfunction, mild to moderate left atrial enlargement, normal right ventricular contraction, mitral annular calcification with mild mitral regurgitation, trace tricuspid regurgitation, PASP 29 mmHg, no pericardial effusion.   Assessment and Plan:  Paroxysmal atrial fibrillation/flutter. She is in sinus rhythm today and reports no palpitations. Plan at this point will be to continue on Cardizem CD at 180 mg daily. May need further medication titration and/or use of antiarrhythmics depending on symptom frequency. As noted above, she declines resuming anticoagulation, CHADSVASC score is 3.  Current medicines were reviewed with the patient today.  No orders of the defined types were placed in this encounter.   Disposition:  Signed, Satira Sark, MD, [facility] 03/14/2017 3:04 PM    [facility] [place], [place], [place] [address] Phone: [phone]; Fax: [phone]

## 2017-03-14 ENCOUNTER — Ambulatory Visit (INDEPENDENT_AMBULATORY_CARE_PROVIDER_SITE_OTHER): Payer: Medicare HMO | Admitting: Cardiology

## 2017-03-14 ENCOUNTER — Encounter: Payer: Self-pay | Admitting: Cardiology

## 2017-03-14 ENCOUNTER — Telehealth: Payer: Self-pay

## 2017-03-14 VITALS — BP 126/64 | HR 83 | Ht 62.0 in | Wt 141.4 lb

## 2017-03-14 DIAGNOSIS — I4891 Unspecified atrial fibrillation: Secondary | ICD-10-CM | POA: Diagnosis not present

## 2017-03-14 DIAGNOSIS — M1990 Unspecified osteoarthritis, unspecified site: Secondary | ICD-10-CM | POA: Diagnosis not present

## 2017-03-14 DIAGNOSIS — I4892 Unspecified atrial flutter: Secondary | ICD-10-CM | POA: Diagnosis not present

## 2017-03-14 DIAGNOSIS — Z79899 Other long term (current) drug therapy: Secondary | ICD-10-CM | POA: Diagnosis not present

## 2017-03-14 DIAGNOSIS — I481 Persistent atrial fibrillation: Secondary | ICD-10-CM | POA: Diagnosis not present

## 2017-03-14 DIAGNOSIS — Z87440 Personal history of urinary (tract) infections: Secondary | ICD-10-CM | POA: Diagnosis not present

## 2017-03-14 DIAGNOSIS — I10 Essential (primary) hypertension: Secondary | ICD-10-CM | POA: Diagnosis not present

## 2017-03-14 DIAGNOSIS — M810 Age-related osteoporosis without current pathological fracture: Secondary | ICD-10-CM | POA: Diagnosis not present

## 2017-03-14 DIAGNOSIS — Z9181 History of falling: Secondary | ICD-10-CM | POA: Diagnosis not present

## 2017-03-14 NOTE — Telephone Encounter (Signed)
Patient notified and verbalized understanding. 

## 2017-03-14 NOTE — Telephone Encounter (Signed)
Patient states Dr. Woody Seller informed her not to drive until she saw cardiologist. Patient forgot to ask during appointment if it was ok for her to drive or not?

## 2017-03-14 NOTE — Patient Instructions (Signed)
Medication Instructions:  Your physician recommends that you continue on your current medications as directed. Please refer to the Current Medication list given to you today.  Labwork: NONE  Testing/Procedures: NONE  Follow-Up: Your physician recommends that you schedule a follow-up appointment in: 3 MONTHS WITH DR. MCDOWELL.  Any Other Special Instructions Will Be Listed Below (If Applicable).  If you need a refill on your cardiac medications before your next appointment, please call your pharmacy. 

## 2017-03-14 NOTE — Telephone Encounter (Signed)
She has not had any syncope. I would not see a specific reason to tell her not to drive related solely to paroxysmal atrial fibrillation, particularly since she is tolerating her current medication.

## 2017-03-15 DIAGNOSIS — Z79899 Other long term (current) drug therapy: Secondary | ICD-10-CM | POA: Diagnosis not present

## 2017-03-15 DIAGNOSIS — I481 Persistent atrial fibrillation: Secondary | ICD-10-CM | POA: Diagnosis not present

## 2017-03-15 DIAGNOSIS — M1990 Unspecified osteoarthritis, unspecified site: Secondary | ICD-10-CM | POA: Diagnosis not present

## 2017-03-15 DIAGNOSIS — Z9181 History of falling: Secondary | ICD-10-CM | POA: Diagnosis not present

## 2017-03-15 DIAGNOSIS — I10 Essential (primary) hypertension: Secondary | ICD-10-CM | POA: Diagnosis not present

## 2017-03-15 DIAGNOSIS — Z87440 Personal history of urinary (tract) infections: Secondary | ICD-10-CM | POA: Diagnosis not present

## 2017-03-15 DIAGNOSIS — M810 Age-related osteoporosis without current pathological fracture: Secondary | ICD-10-CM | POA: Diagnosis not present

## 2017-03-16 DIAGNOSIS — I4891 Unspecified atrial fibrillation: Secondary | ICD-10-CM | POA: Diagnosis not present

## 2017-03-16 DIAGNOSIS — Z809 Family history of malignant neoplasm, unspecified: Secondary | ICD-10-CM | POA: Diagnosis not present

## 2017-03-16 DIAGNOSIS — Z8249 Family history of ischemic heart disease and other diseases of the circulatory system: Secondary | ICD-10-CM | POA: Diagnosis not present

## 2017-04-06 DIAGNOSIS — I4891 Unspecified atrial fibrillation: Secondary | ICD-10-CM | POA: Diagnosis not present

## 2017-04-06 DIAGNOSIS — Z6825 Body mass index (BMI) 25.0-25.9, adult: Secondary | ICD-10-CM | POA: Diagnosis not present

## 2017-04-06 DIAGNOSIS — Z299 Encounter for prophylactic measures, unspecified: Secondary | ICD-10-CM | POA: Diagnosis not present

## 2017-04-06 DIAGNOSIS — I10 Essential (primary) hypertension: Secondary | ICD-10-CM | POA: Diagnosis not present

## 2017-04-06 DIAGNOSIS — Z789 Other specified health status: Secondary | ICD-10-CM | POA: Diagnosis not present

## 2017-04-09 ENCOUNTER — Encounter (HOSPITAL_COMMUNITY): Payer: Self-pay | Admitting: *Deleted

## 2017-04-09 ENCOUNTER — Emergency Department (HOSPITAL_COMMUNITY)
Admission: EM | Admit: 2017-04-09 | Discharge: 2017-04-09 | Disposition: A | Discharge status: Home / Self Care | Payer: Medicare HMO | Attending: Emergency Medicine | Admitting: Emergency Medicine

## 2017-04-09 DIAGNOSIS — R002 Palpitations: Secondary | ICD-10-CM | POA: Diagnosis present

## 2017-04-09 DIAGNOSIS — I480 Paroxysmal atrial fibrillation: Secondary | ICD-10-CM | POA: Insufficient documentation

## 2017-04-09 LAB — CBC WITH DIFFERENTIAL/PLATELET
BASOS ABS: 0 10*3/uL (ref 0.0–0.1)
BASOS PCT: 0 %
EOS ABS: 0 10*3/uL (ref 0.0–0.7)
Eosinophils Relative: 0 %
HCT: 42.7 % (ref 36.0–46.0)
HEMOGLOBIN: 13.8 g/dL (ref 12.0–15.0)
LYMPHS ABS: 1.1 10*3/uL (ref 0.7–4.0)
Lymphocytes Relative: 16 %
MCH: 29.2 pg (ref 26.0–34.0)
MCHC: 32.3 g/dL (ref 30.0–36.0)
MCV: 90.5 fL (ref 78.0–100.0)
Monocytes Absolute: 0.6 10*3/uL (ref 0.1–1.0)
Monocytes Relative: 9 %
NEUTROS PCT: 75 %
Neutro Abs: 5 10*3/uL (ref 1.7–7.7)
Platelets: 218 10*3/uL (ref 150–400)
RBC: 4.72 MIL/uL (ref 3.87–5.11)
RDW: 13.5 % (ref 11.5–15.5)
WBC: 6.8 10*3/uL (ref 4.0–10.5)

## 2017-04-09 LAB — BASIC METABOLIC PANEL
ANION GAP: 13 (ref 5–15)
BUN: 16 mg/dL (ref 6–20)
CO2: 21 mmol/L — ABNORMAL LOW (ref 22–32)
Calcium: 9.4 mg/dL (ref 8.9–10.3)
Chloride: 104 mmol/L (ref 101–111)
Creatinine, Ser: 0.78 mg/dL (ref 0.44–1.00)
GFR calc Af Amer: >60 mL/min (ref >60)
Glucose, Bld: 99 mg/dL (ref 65–99)
POTASSIUM: 4.2 mmol/L (ref 3.5–5.1)
SODIUM: 138 mmol/L (ref 135–145)

## 2017-04-09 LAB — TROPONIN I

## 2017-04-09 MED ORDER — DILTIAZEM HCL ER 240 MG PO CP24: 240.0000 mg | ORAL_CAPSULE | Freq: Every day | ORAL | 1 refills | Status: DC

## 2017-04-09 MED ORDER — DILTIAZEM HCL 30 MG PO TABS
30.0000 mg | ORAL_TABLET | Freq: Once | ORAL | Status: AC
  Administered 2017-04-09: 30 mg via ORAL
  Filled 2017-04-09: qty 1

## 2017-04-09 NOTE — ED Provider Notes (Signed)
Emergency Department Provider Note   I have reviewed the triage vital signs and the nursing notes.   HISTORY  Chief Complaint Atrial Fibrillation   HPI Meredith Bennett is a 84 y.o. female with history of atrial fibrillation on Cardizem that recently increase her dose from 160 daily to 180 mg daily a couple weeks ago.  She been doing fine for the last few days she has had a return of her palpitations.  She had episode last night that lasted a while was associated with shortness of breath so she took a one-time dose of 30 mg of diltiazem which helped it get better.  She woke up this morning and was having palpitations and rapid heart rate again and called her doctor who sent her here for further evaluation.  Outside of these episodes she has no shortness of breath, chest pain, fever, cough, urinary symptoms, rashes or other associated symptoms.  She says that she has refused anticoagulation in the past secondary to concerns for her age and fall risk.  She continues to decline this. No other associated or modifying symptoms.    Past Medical History:  Diagnosis Date  . Atrial fibrillation and flutter ([place])    Atrial flutter documented 2013  . Back pain     Patient Active Problem List   Diagnosis Date Noted  . Atrial fibrillation ([place]) 09/11/2011    Past Surgical History:  Procedure Laterality Date  . BACK SURGERY     April 2011 and November 2011  . BREAST BIOPSY    . TOTAL ABDOMINAL HYSTERECTOMY W/ BILATERAL SALPINGOOPHORECTOMY      Current Outpatient Rx  . Order #: [id number] Class: Historical Med  . [START ON 04/10/2017] Order #: [id number] Class: Print  . Order #: [id number] Class: Historical Med  . Order #: [id number] Class: Historical Med    Allergies Fosamax [alendronate sodium] and Penicillins  Family History  Problem Relation Age of Onset  . Hypertension Mother   . Colon cancer Father     Social History Social History   Tobacco Use  . Smoking status: Never Smoker    . Smokeless tobacco: Never Used  Substance Use Topics  . Alcohol use: No  . Drug use: No    Review of Systems  All other systems negative except as documented in the HPI. All pertinent positives and negatives as reviewed in the HPI. ____________________________________________   PHYSICAL EXAM:  VITAL SIGNS: ED Triage Vitals  Enc Vitals Group     BP 04/09/17 1129 (!) 151/78     Pulse Rate 04/09/17 1129 96     Resp 04/09/17 1129 18     Temp 04/09/17 1129 97.9 F (36.6 C)     Temp Source 04/09/17 1129 Oral     SpO2 04/09/17 1129 96 %     Weight 04/09/17 1125 138 lb (62.6 kg)     Height 04/09/17 1125 5\' 1"  (1.549 m)     Head Circumference --      Peak Flow --      Pain Score --      Pain Loc --      Pain Edu? --      Excl. in [place]? --     Constitutional: Alert and oriented. Well appearing and in no acute distress. Eyes: Conjunctivae are normal. PERRL. EOMI. Head: Atraumatic. Nose: No congestion/rhinnorhea. Mouth/Throat: Mucous membranes are moist.  Oropharynx non-erythematous. Neck: No stridor.  No meningeal signs.   Cardiovascular: Normal rate, regular rhythm. Good peripheral circulation. Grossly normal heart  sounds.   Respiratory: Normal respiratory effort.  No retractions. Lungs CTAB. Gastrointestinal: Soft and nontender. No distention.  Musculoskeletal: No lower extremity tenderness nor edema. No gross deformities of extremities. Neurologic:  Normal speech and language. No gross focal neurologic deficits are appreciated.  Skin:  Skin is warm, dry and intact. No rash noted.  ____________________________________________   LABS (all labs ordered are listed, but only abnormal results are displayed)  Labs Reviewed  BASIC METABOLIC PANEL - Abnormal; Notable for the following components:      Result Value   CO2 21 (*)    All other components within normal limits  CBC WITH DIFFERENTIAL/PLATELET  TROPONIN I   ____________________________________________  EKG    EKG Interpretation  Date/Time:  Monday April 09 2017 11:28:36 EST Ventricular Rate:  95 PR Interval:    QRS Duration: 85 QT Interval:  341 QTC Calculation: 429 R Axis:   95 Text Interpretation:  Sinus rhythm Right ventricular hypertrophy Confirmed by Merrily Pew [phone]) on 04/09/2017 12:06:32 PM       ____________________________________________   INITIAL IMPRESSION / ASSESSMENT AND PLAN / ED COURSE  Suspect paroxysmal atrial fibrillation as a cause for her symptoms.  She is currently in a sinus rhythm with a heart rate in the 90s and slightly elevated blood pressure.  I will discuss with cardiology about possibly increasing her diltiazem dose and following up as an outpatient.  Patient continues to refuse anticoagulation which I think is appropriate in this 84 year old competent female.  Discussed with Dr. Harrington Challenger who agrees with increzasing diltiazem and suggests 240 XR dailt. 30 given here, and will take 30 tonight at home and increase dose tomorrow. No indication for further workup or admission as she is in NSR at this time. Will fu w/ Dr. Domenic Polite regarding same.      Pertinent labs & imaging results that were available during my care of the patient were reviewed by me and considered in my medical decision making (see chart for details).  ____________________________________________  FINAL CLINICAL IMPRESSION(S) / ED DIAGNOSES  Final diagnoses:  Paroxysmal atrial fibrillation ([place])     MEDICATIONS GIVEN DURING THIS VISIT:  Medications  diltiazem (CARDIZEM) tablet 30 mg (30 mg Oral Given 04/09/17 1259)     NEW OUTPATIENT MEDICATIONS STARTED DURING THIS VISIT:  Discharge Medication List as of 04/09/2017 12:51 PM    START taking these medications   Details  diltiazem (DILACOR XR) 240 MG 24 hr capsule Take 1 capsule (240 mg total) by mouth daily., Starting Tue 04/10/2017, Print        Note:  This note was prepared with assistance of Dragon voice recognition  software. Occasional wrong-word or sound-a-like substitutions may have occurred due to the inherent limitations of voice recognition software.   Merrily Pew, MD 04/09/17 [phone]

## 2017-04-09 NOTE — ED Triage Notes (Signed)
Pt denies pain, states her heart felt fast since last night. Pt with hx of afib. Pt denies sob as well.

## 2017-04-09 NOTE — Discharge Instructions (Signed)
Take one dose of your 30 mg diltiazem tonight. Then start the 240 mg daily tomorrow and follow up with Dr. Domenic Polite as directed.

## 2017-04-11 ENCOUNTER — Telehealth: Payer: Self-pay | Admitting: Cardiology

## 2017-04-11 NOTE — Telephone Encounter (Signed)
She could try taking a Cardizem 30 mg tablet at night before bedtime as long as heart rate is not below 65.

## 2017-04-11 NOTE — Telephone Encounter (Signed)
Went to [facility] on Monday.  Stated that her Diltiazem CD was increased to 240mg  daily.  No symptoms now.  Stated that she is having issue at bedtime.  Feels heart racing at times.  Wanted to know if could be given something to take at night.  Explained to her that she does have the Diltiazem 30mg  (short acting) tabs to take as needed for heart rate greater than 140.  Stated that she would like to take something before it got that high.  Please advise on a range of numbers that is safe for her take this medication.  Explained to her that normal heart rate is between 60-100.  If she does not get a reply before end of day, to go ahead and take one tab if heart rate was over 100 and she felt symptomatic.  She verbalized understanding.

## 2017-04-11 NOTE — Telephone Encounter (Signed)
Wants to speak with Dr Domenic Polite about her AFIB.  Feels like she is going to have another spell at night. Would like to know if there is anything that can be taken at night

## 2017-04-12 ENCOUNTER — Emergency Department (HOSPITAL_COMMUNITY): Payer: Medicare HMO

## 2017-04-12 ENCOUNTER — Encounter (HOSPITAL_COMMUNITY): Payer: Self-pay | Admitting: Emergency Medicine

## 2017-04-12 ENCOUNTER — Emergency Department (HOSPITAL_COMMUNITY)
Admission: EM | Admit: 2017-04-12 | Discharge: 2017-04-12 | Disposition: A | Discharge status: Home / Self Care | Payer: Medicare HMO | Attending: Emergency Medicine | Admitting: Emergency Medicine

## 2017-04-12 DIAGNOSIS — Z79899 Other long term (current) drug therapy: Secondary | ICD-10-CM | POA: Insufficient documentation

## 2017-04-12 DIAGNOSIS — I4892 Unspecified atrial flutter: Secondary | ICD-10-CM | POA: Diagnosis not present

## 2017-04-12 DIAGNOSIS — R079 Chest pain, unspecified: Secondary | ICD-10-CM | POA: Diagnosis not present

## 2017-04-12 DIAGNOSIS — K219 Gastro-esophageal reflux disease without esophagitis: Secondary | ICD-10-CM | POA: Diagnosis not present

## 2017-04-12 DIAGNOSIS — R002 Palpitations: Secondary | ICD-10-CM | POA: Diagnosis present

## 2017-04-12 DIAGNOSIS — J9811 Atelectasis: Secondary | ICD-10-CM | POA: Diagnosis not present

## 2017-04-12 LAB — CBC
HCT: 44.7 % (ref 36.0–46.0)
Hemoglobin: 14.1 g/dL (ref 12.0–15.0)
MCH: 28.8 pg (ref 26.0–34.0)
MCHC: 31.5 g/dL (ref 30.0–36.0)
MCV: 91.2 fL (ref 78.0–100.0)
Platelets: 231 10*3/uL (ref 150–400)
RBC: 4.9 MIL/uL (ref 3.87–5.11)
RDW: 13.5 % (ref 11.5–15.5)
WBC: 11.2 10*3/uL — AB (ref 4.0–10.5)

## 2017-04-12 LAB — URINALYSIS, ROUTINE W REFLEX MICROSCOPIC
Bilirubin Urine: NEGATIVE
Glucose, UA: NEGATIVE mg/dL
HGB URINE DIPSTICK: NEGATIVE
Ketones, ur: NEGATIVE mg/dL
LEUKOCYTES UA: NEGATIVE
Nitrite: NEGATIVE
Protein, ur: NEGATIVE mg/dL
SPECIFIC GRAVITY, URINE: 1.014 (ref 1.005–1.030)
pH: 6 (ref 5.0–8.0)

## 2017-04-12 LAB — COMPREHENSIVE METABOLIC PANEL
ALBUMIN: 3.3 g/dL — AB (ref 3.5–5.0)
ALT: 12 U/L — AB (ref 14–54)
AST: 17 U/L (ref 15–41)
Alkaline Phosphatase: 106 U/L (ref 38–126)
Anion gap: 10 (ref 5–15)
BUN: 18 mg/dL (ref 6–20)
CHLORIDE: 103 mmol/L (ref 101–111)
CO2: 24 mmol/L (ref 22–32)
CREATININE: 0.7 mg/dL (ref 0.44–1.00)
Calcium: 9 mg/dL (ref 8.9–10.3)
GFR calc Af Amer: >60 mL/min (ref >60)
GLUCOSE: 107 mg/dL — AB (ref 65–99)
POTASSIUM: 4 mmol/L (ref 3.5–5.1)
SODIUM: 137 mmol/L (ref 135–145)
Total Bilirubin: 0.4 mg/dL (ref 0.3–1.2)
Total Protein: 5.9 g/dL — ABNORMAL LOW (ref 6.5–8.1)

## 2017-04-12 LAB — MAGNESIUM: MAGNESIUM: 2 mg/dL (ref 1.7–2.4)

## 2017-04-12 LAB — TROPONIN I: Troponin I: <0.03 ng/mL (ref <0.03)

## 2017-04-12 MED ORDER — DILTIAZEM HCL 25 MG/5ML IV SOLN
10.0000 mg | Freq: Once | INTRAVENOUS | Status: AC
  Administered 2017-04-12: 10 mg via INTRAVENOUS
  Filled 2017-04-12: qty 5

## 2017-04-12 MED ORDER — PANTOPRAZOLE SODIUM 20 MG PO TBEC: 20.0000 mg | DELAYED_RELEASE_TABLET | Freq: Every day | ORAL | 0 refills | Status: DC

## 2017-04-12 NOTE — ED Triage Notes (Signed)
Patient complaining of palpitations and weakness since last night.

## 2017-04-12 NOTE — Telephone Encounter (Signed)
Patient notified.  Stated she did have to go to the ED today - had another really bad episode.  Is having some reflux exasperating her atrial fib.  OV moved up to 05/18/2017 with Dr. Domenic Polite.

## 2017-04-12 NOTE — ED Notes (Signed)
Pt's HR noted to be 86. New EKG performed. Notified Dr. Lita Mains.

## 2017-04-12 NOTE — ED Provider Notes (Signed)
[facility] EMERGENCY DEPARTMENT Provider Note   CSN: [id number] Arrival date & time: 04/12/17  2130     History   Chief Complaint Chief Complaint  Patient presents with  . Palpitations    HPI Meredith Bennett is a 84 y.o. female.  HPI Patient with history of atrial fibrillation states she began having burning sensation up into her chest yesterday evening around 11 PM.  She states she began having rapid palpitations at that time.  States the burning sensation has improved though she continues to have palpitations.  Denies any shortness of breath.  Denies any new lower extremity swelling or pain. Past Medical History:  Diagnosis Date  . Atrial fibrillation and flutter ([place])    Atrial flutter documented 2013  . Back pain     Patient Active Problem List   Diagnosis Date Noted  . Atrial fibrillation ([place]) 09/11/2011    Past Surgical History:  Procedure Laterality Date  . BACK SURGERY     April 2011 and November 2011  . BREAST BIOPSY    . TOTAL ABDOMINAL HYSTERECTOMY W/ BILATERAL SALPINGOOPHORECTOMY      OB History    No data available       Home Medications    Prior to Admission medications   Medication Sig Start Date End Date Taking? Authorizing Provider  diltiazem (CARDIZEM) 30 MG tablet Take 1 tablet daily AS NEEDED FOR HEART RATE GREATER THAN 140 04/06/17  Yes [provider]  diltiazem (DILACOR XR) 240 MG 24 hr capsule Take 1 capsule (240 mg total) by mouth daily. 04/10/17  Yes Mesner, Corene Cornea, MD  ergocalciferol (VITAMIN D2) 50000 UNITS capsule Take 50,000 Units by mouth once a week.   Yes [provider]  Multiple Vitamins-Minerals (EYE VITAMINS PO) Take 1 capsule by mouth 2 (two) times daily.   Yes [provider]  pantoprazole (PROTONIX) 20 MG tablet Take 1 tablet (20 mg total) by mouth daily. 04/12/17   Julianne Rice, MD    Family History Family History  Problem Relation Age of Onset  . Hypertension Mother   . Colon cancer  Father     Social History Social History   Tobacco Use  . Smoking status: Never Smoker  . Smokeless tobacco: Never Used  Substance Use Topics  . Alcohol use: No  . Drug use: No     Allergies   Fosamax [alendronate sodium] and Penicillins   Review of Systems Review of Systems  Constitutional: Negative for chills and fever.  HENT: Negative for sore throat and trouble swallowing.   Eyes: Negative for photophobia and visual disturbance.  Respiratory: Negative for cough and shortness of breath.   Cardiovascular: Positive for chest pain. Negative for palpitations and leg swelling.  Gastrointestinal: Negative for abdominal pain, constipation, diarrhea, nausea and vomiting.  Musculoskeletal: Negative for back pain, myalgias, neck pain and neck stiffness.  Skin: Negative for rash.  Neurological: Negative for dizziness, syncope, weakness, light-headedness, numbness and headaches.  All other systems reviewed and are negative.    Physical Exam Updated Vital Signs BP 135/67   Pulse 80   Temp 98.2 F (36.8 C) (Oral)   Resp (!) 23   Ht 5\' 1"  (1.549 m)   Wt 62.6 kg (138 lb)   SpO2 100%   BMI 26.07 kg/m   Physical Exam  Constitutional: She is oriented to person, place, and time. She appears well-developed and well-nourished. No distress.  HENT:  Head: Normocephalic and atraumatic.  Mouth/Throat: Oropharynx is clear and moist.  No oropharyngeal exudate.  Eyes: EOM are normal. Pupils are equal, round, and reactive to light.  Neck: Normal range of motion. Neck supple. No JVD present.  Cardiovascular:  Tachycardia.  Irregularly irregular.  Pulmonary/Chest: Effort normal and breath sounds normal. No stridor. No respiratory distress. She has no wheezes. She has no rales. She exhibits no tenderness.  Abdominal: Soft. Bowel sounds are normal. There is no tenderness. There is no rebound and no guarding.  Musculoskeletal: Normal range of motion. She exhibits no edema or tenderness.  No  lower extremity swelling, asymmetry or tenderness.  Distal pulses are 2+.  Lymphadenopathy:    She has no cervical adenopathy.  Neurological: She is alert and oriented to person, place, and time.  Moving all extremities without focal deficit.  Sensation intact.  Skin: Skin is warm and dry. Capillary refill takes less than 2 seconds. No rash noted. She is not diaphoretic. No erythema.  Psychiatric: She has a normal mood and affect. Her behavior is normal.  Nursing note and vitals reviewed.    ED Treatments / Results  Labs (all labs ordered are listed, but only abnormal results are displayed) Labs Reviewed  CBC - Abnormal; Notable for the following components:      Result Value   WBC 11.2 (*)    All other components within normal limits  URINALYSIS, ROUTINE W REFLEX MICROSCOPIC - Abnormal; Notable for the following components:   APPearance HAZY (*)    All other components within normal limits  COMPREHENSIVE METABOLIC PANEL - Abnormal; Notable for the following components:   Glucose, Bld 107 (*)    Total Protein 5.9 (*)    Albumin 3.3 (*)    ALT 12 (*)    All other components within normal limits  MAGNESIUM  TROPONIN I    EKG  EKG Interpretation  Date/Time:  Thursday April 12 2017 08:04:12 EST Ventricular Rate:  131 PR Interval:    QRS Duration: 109 QT Interval:  319 QTC Calculation: 471 R Axis:   0 Text Interpretation:  Atrial flutter Ventricular premature complex Indeterminate axis Low voltage, precordial leads Consider right ventricular hypertrophy Confirmed by Julianne Rice [phone]) on 04/12/2017 9:01:41 AM       Radiology Dg Chest Port 1 View  Result Date: 04/12/2017 CLINICAL DATA:  84 year old female with weakness and palpitations since last night. History of atrial fibrillation. EXAM: PORTABLE CHEST 1 VIEW COMPARISON:  03/02/2017 portable chest, and earlier. FINDINGS: Moderately low lung volumes compared to [facility] chest radiographs from  January and earlier. Crowding of bilateral lung markings. No pulmonary edema suspected. No pneumothorax. No definite pleural effusion. No consolidation. Mediastinal contours remain normal. Calcified aortic atherosclerosis. Visualized tracheal air column is within normal limits. Paucity of bowel gas in the upper abdomen. No acute osseous abnormality identified. IMPRESSION: Low lung volumes with atelectasis. No other acute cardiopulmonary abnormality. Electronically Signed   By: Genevie Ann M.D.   On: 04/12/2017 08:33    Procedures Procedures (including critical care time)  Medications Ordered in ED Medications  diltiazem (CARDIZEM) injection 10 mg (10 mg Intravenous Given 04/12/17 0825)     Initial Impression / Assessment and Plan / ED Course  I have reviewed the triage vital signs and the nursing notes.  Pertinent labs & imaging results that were available during my care of the patient were reviewed by me and considered in my medical decision making (see chart for details).     Patient converted to normal sinus rhythm with single dose of  IV Cardizem.  Labs are unremarkable.  Suspect patient may be having some reflux and aspiration which could be possibly exacerbating her atrial flutter.  Will start on PPI.  Advised to sleep with the head of the bed up.  She will follow-up with her cardiologist and with her primary physician.  Return precautions have been given.  Final Clinical Impressions(s) / ED Diagnoses   Final diagnoses:  Atrial flutter with rapid ventricular response ([place])  Gastroesophageal reflux disease, esophagitis presence not specified    ED Discharge Orders        Ordered    pantoprazole (PROTONIX) 20 MG tablet  Daily     04/12/17 1213       Julianne Rice, MD 04/12/17 1215

## 2017-04-13 DIAGNOSIS — Z6825 Body mass index (BMI) 25.0-25.9, adult: Secondary | ICD-10-CM | POA: Diagnosis not present

## 2017-04-13 DIAGNOSIS — I471 Supraventricular tachycardia: Secondary | ICD-10-CM | POA: Diagnosis not present

## 2017-04-13 DIAGNOSIS — Z299 Encounter for prophylactic measures, unspecified: Secondary | ICD-10-CM | POA: Diagnosis not present

## 2017-04-13 DIAGNOSIS — I10 Essential (primary) hypertension: Secondary | ICD-10-CM | POA: Diagnosis not present

## 2017-04-13 DIAGNOSIS — I4891 Unspecified atrial fibrillation: Secondary | ICD-10-CM | POA: Diagnosis not present

## 2017-04-17 DIAGNOSIS — I4891 Unspecified atrial fibrillation: Secondary | ICD-10-CM | POA: Diagnosis not present

## 2017-04-17 DIAGNOSIS — I10 Essential (primary) hypertension: Secondary | ICD-10-CM | POA: Diagnosis not present

## 2017-04-20 DIAGNOSIS — I4891 Unspecified atrial fibrillation: Secondary | ICD-10-CM | POA: Diagnosis not present

## 2017-04-20 DIAGNOSIS — I10 Essential (primary) hypertension: Secondary | ICD-10-CM | POA: Diagnosis not present

## 2017-04-20 DIAGNOSIS — Z6825 Body mass index (BMI) 25.0-25.9, adult: Secondary | ICD-10-CM | POA: Diagnosis not present

## 2017-04-20 DIAGNOSIS — Z713 Dietary counseling and surveillance: Secondary | ICD-10-CM | POA: Diagnosis not present

## 2017-04-20 DIAGNOSIS — Z299 Encounter for prophylactic measures, unspecified: Secondary | ICD-10-CM | POA: Diagnosis not present

## 2017-05-07 DIAGNOSIS — Z789 Other specified health status: Secondary | ICD-10-CM | POA: Diagnosis not present

## 2017-05-07 DIAGNOSIS — Z299 Encounter for prophylactic measures, unspecified: Secondary | ICD-10-CM | POA: Diagnosis not present

## 2017-05-07 DIAGNOSIS — Z713 Dietary counseling and surveillance: Secondary | ICD-10-CM | POA: Diagnosis not present

## 2017-05-07 DIAGNOSIS — R600 Localized edema: Secondary | ICD-10-CM | POA: Diagnosis not present

## 2017-05-07 DIAGNOSIS — Z6826 Body mass index (BMI) 26.0-26.9, adult: Secondary | ICD-10-CM | POA: Diagnosis not present

## 2017-05-14 DIAGNOSIS — I10 Essential (primary) hypertension: Secondary | ICD-10-CM | POA: Diagnosis not present

## 2017-05-14 DIAGNOSIS — I471 Supraventricular tachycardia: Secondary | ICD-10-CM | POA: Diagnosis not present

## 2017-05-14 DIAGNOSIS — I4891 Unspecified atrial fibrillation: Secondary | ICD-10-CM | POA: Diagnosis not present

## 2017-05-14 DIAGNOSIS — Z6826 Body mass index (BMI) 26.0-26.9, adult: Secondary | ICD-10-CM | POA: Diagnosis not present

## 2017-05-14 DIAGNOSIS — R609 Edema, unspecified: Secondary | ICD-10-CM | POA: Diagnosis not present

## 2017-05-14 DIAGNOSIS — Z299 Encounter for prophylactic measures, unspecified: Secondary | ICD-10-CM | POA: Diagnosis not present

## 2017-05-17 NOTE — Progress Notes (Signed)
Cardiology Office Note  Date: 05/18/2017   ID: Meredith Bennett, DOB [birth year], MRN [id number]  PCP: Glenda Chroman, MD  Primary Cardiologist: Rozann Lesches, MD   Chief Complaint  Patient presents with  . PAF    History of Present Illness: Meredith Bennett is a 84 y.o. female that I met recently in January.  She presents to the office after recent ER visit in February at which point she had a recurrent episode of rapid atrial fibrillation that resolved spontaneously with IV Cardizem.  Since that time she was seen by her PCP and placed on low-dose Toprol-XL to take in the evenings.  She does not report any palpitations since that time and has continued on Cardizem CD.  Does have leg swelling associated with her calcium channel blocker.  She does keep her legs elevated and has been using stockings.  CHADSVASC score is 3. She recalls having GI bleeding previously when on Xarelto, and is not interested in resuming anticoagulation.  I personally reviewed her ECG today which shows sinus rhythm with PVC, R' in lead V1 and V2.  Past Medical History:  Diagnosis Date  . Atrial fibrillation and flutter ([place])    Atrial flutter documented 2013  . Back pain     Past Surgical History:  Procedure Laterality Date  . BACK SURGERY     April 2011 and November 2011  . BREAST BIOPSY    . TOTAL ABDOMINAL HYSTERECTOMY W/ BILATERAL SALPINGOOPHORECTOMY      Current Outpatient Medications  Medication Sig Dispense Refill  . diltiazem (DILACOR XR) 240 MG 24 hr capsule Take 1 capsule (240 mg total) by mouth daily. 30 capsule 1  . ergocalciferol (VITAMIN D2) 50000 UNITS capsule Take 50,000 Units by mouth once a week.    . metoprolol succinate (TOPROL-XL) 25 MG 24 hr tablet Take 1 tablet by mouth every evening. At 5:00 pm  4  . Multiple Vitamins-Minerals (EYE VITAMINS PO) Take 1 capsule by mouth 2 (two) times daily.     No current facility-administered medications for this visit.    Allergies:   Fosamax [alendronate sodium] and Penicillins   Social History: The patient  reports that she has never smoked. She has never used smokeless tobacco. She reports that she does not drink alcohol or use drugs.   ROS:  Please see the history of present illness. Otherwise, complete review of systems is positive for hearing loss.  All other systems are reviewed and negative.   Physical Exam: VS:  BP (!) 106/55   Pulse 67   Ht 5' 2"  (1.575 m)   Wt 147 lb (66.7 kg)   SpO2 98%   BMI 26.89 kg/m , BMI Body mass index is 26.89 kg/m.  Wt Readings from Last 3 Encounters:  05/18/17 147 lb (66.7 kg)  04/12/17 138 lb (62.6 kg)  04/09/17 138 lb (62.6 kg)    General: Elderly woman, appears comfortable at rest. HEENT: Conjunctiva and lids normal, oropharynx clear. Neck: Supple, no elevated JVP or carotid bruits, no thyromegaly. Lungs: Clear to auscultation, nonlabored breathing at rest. Cardiac: Regular rate and rhythm, no S3, soft systolic murmur. Abdomen: Soft, nontender, bowel sounds present. Extremities: Mild lower leg edema, distal pulses 2+. Skin: Warm and dry. Musculoskeletal: No kyphosis. Neuropsychiatric: Alert and oriented x3, affect grossly appropriate.  ECG: I personally reviewed the tracing from 04/12/2017 which showed atrial fibrillation with rapid ventricular response.  Recent Labwork: 04/12/2017: ALT 12; AST 17; BUN 18; Creatinine, Ser 0.70; Hemoglobin  14.1; Magnesium 2.0; Platelets 231; Potassium 4.0; Sodium 137   Other Studies Reviewed Today:  Echocardiogram 08/30/2011 [facility]): Mild LVH with LVEF 24-29%, grade 2 diastolic dysfunction, mild to moderate left atrial enlargement, normal right ventricular contraction, mitral annular calcification with mild mitral regurgitation, trace tricuspid regurgitation, PASP 29 mmHg, no pericardial effusion.   Assessment and Plan:  Paroxysmal atrial fibrillation/flutter.  She is in sinus rhythm today.  I reviewed her current regimen which  now includes low-dose Toprol-XL in the evening with Cardizem CD 240 mg in the morning.  She reports good control of symptoms on this combination of medications and we will continue with current plan.  As noted previously she declines anticoagulation.  Her CHADSVASC score is 3.  If breakthrough arrhythmia continues, may need to consider addition of antiarrhythmic therapy.  Current medicines were reviewed with the patient today.   Orders Placed This Encounter  Procedures  . EKG 12-Lead    Disposition: Follow-up in 3 months.  Signed, Satira Sark, MD, [facility] 05/18/2017 2:07 PM    [facility] [place], [place], [place] [address] Phone: [phone]; Fax: [phone]

## 2017-05-18 ENCOUNTER — Ambulatory Visit: Payer: Medicare HMO | Admitting: Cardiology

## 2017-05-18 ENCOUNTER — Encounter: Payer: Self-pay | Admitting: Cardiology

## 2017-05-18 VITALS — BP 106/55 | HR 67 | Ht 62.0 in | Wt 147.0 lb

## 2017-05-18 DIAGNOSIS — I4891 Unspecified atrial fibrillation: Secondary | ICD-10-CM

## 2017-05-18 DIAGNOSIS — I4892 Unspecified atrial flutter: Secondary | ICD-10-CM | POA: Diagnosis not present

## 2017-05-18 NOTE — Patient Instructions (Signed)
Medication Instructions:   Your physician recommends that you continue on your current medications as directed. Please refer to the Current Medication list given to you today.  Labwork:  NONE  Testing/Procedures:  NONE  Follow-Up:  Your physician recommends that you schedule a follow-up appointment in: 3 months.  Any Other Special Instructions Will Be Listed Below (If Applicable).  If you need a refill on your cardiac medications before your next appointment, please call your pharmacy. 

## 2017-06-01 DIAGNOSIS — I4891 Unspecified atrial fibrillation: Secondary | ICD-10-CM | POA: Diagnosis not present

## 2017-06-01 DIAGNOSIS — Z299 Encounter for prophylactic measures, unspecified: Secondary | ICD-10-CM | POA: Diagnosis not present

## 2017-06-01 DIAGNOSIS — R600 Localized edema: Secondary | ICD-10-CM | POA: Diagnosis not present

## 2017-06-01 DIAGNOSIS — I471 Supraventricular tachycardia: Secondary | ICD-10-CM | POA: Diagnosis not present

## 2017-06-01 DIAGNOSIS — Z6826 Body mass index (BMI) 26.0-26.9, adult: Secondary | ICD-10-CM | POA: Diagnosis not present

## 2017-06-01 DIAGNOSIS — I10 Essential (primary) hypertension: Secondary | ICD-10-CM | POA: Diagnosis not present

## 2017-06-07 DIAGNOSIS — Z299 Encounter for prophylactic measures, unspecified: Secondary | ICD-10-CM | POA: Diagnosis not present

## 2017-06-07 DIAGNOSIS — Z79899 Other long term (current) drug therapy: Secondary | ICD-10-CM | POA: Diagnosis not present

## 2017-06-07 DIAGNOSIS — I10 Essential (primary) hypertension: Secondary | ICD-10-CM | POA: Diagnosis not present

## 2017-06-07 DIAGNOSIS — I4891 Unspecified atrial fibrillation: Secondary | ICD-10-CM | POA: Diagnosis not present

## 2017-06-07 DIAGNOSIS — Z6826 Body mass index (BMI) 26.0-26.9, adult: Secondary | ICD-10-CM | POA: Diagnosis not present

## 2017-06-07 DIAGNOSIS — R600 Localized edema: Secondary | ICD-10-CM | POA: Diagnosis not present

## 2017-06-26 ENCOUNTER — Ambulatory Visit: Payer: Medicare HMO | Admitting: Cardiology

## 2017-07-03 DIAGNOSIS — I4891 Unspecified atrial fibrillation: Secondary | ICD-10-CM | POA: Diagnosis not present

## 2017-07-03 DIAGNOSIS — I10 Essential (primary) hypertension: Secondary | ICD-10-CM | POA: Diagnosis not present

## 2017-07-05 DIAGNOSIS — I4891 Unspecified atrial fibrillation: Secondary | ICD-10-CM | POA: Diagnosis not present

## 2017-07-05 DIAGNOSIS — Z299 Encounter for prophylactic measures, unspecified: Secondary | ICD-10-CM | POA: Diagnosis not present

## 2017-07-05 DIAGNOSIS — I10 Essential (primary) hypertension: Secondary | ICD-10-CM | POA: Diagnosis not present

## 2017-07-05 DIAGNOSIS — R600 Localized edema: Secondary | ICD-10-CM | POA: Diagnosis not present

## 2017-07-05 DIAGNOSIS — Z6826 Body mass index (BMI) 26.0-26.9, adult: Secondary | ICD-10-CM | POA: Diagnosis not present

## 2017-07-05 DIAGNOSIS — I471 Supraventricular tachycardia: Secondary | ICD-10-CM | POA: Diagnosis not present

## 2017-08-21 DIAGNOSIS — Z6826 Body mass index (BMI) 26.0-26.9, adult: Secondary | ICD-10-CM | POA: Diagnosis not present

## 2017-08-21 DIAGNOSIS — Z1339 Encounter for screening examination for other mental health and behavioral disorders: Secondary | ICD-10-CM | POA: Diagnosis not present

## 2017-08-21 DIAGNOSIS — I10 Essential (primary) hypertension: Secondary | ICD-10-CM | POA: Diagnosis not present

## 2017-08-21 DIAGNOSIS — Z7189 Other specified counseling: Secondary | ICD-10-CM | POA: Diagnosis not present

## 2017-08-21 DIAGNOSIS — R5383 Other fatigue: Secondary | ICD-10-CM | POA: Diagnosis not present

## 2017-08-21 DIAGNOSIS — Z1331 Encounter for screening for depression: Secondary | ICD-10-CM | POA: Diagnosis not present

## 2017-08-21 DIAGNOSIS — Z0000 Encounter for general adult medical examination without abnormal findings: Secondary | ICD-10-CM | POA: Diagnosis not present

## 2017-08-21 DIAGNOSIS — I471 Supraventricular tachycardia: Secondary | ICD-10-CM | POA: Diagnosis not present

## 2017-08-21 DIAGNOSIS — Z299 Encounter for prophylactic measures, unspecified: Secondary | ICD-10-CM | POA: Diagnosis not present

## 2017-08-21 DIAGNOSIS — I4891 Unspecified atrial fibrillation: Secondary | ICD-10-CM | POA: Diagnosis not present

## 2017-08-22 DIAGNOSIS — Z79899 Other long term (current) drug therapy: Secondary | ICD-10-CM | POA: Diagnosis not present

## 2017-08-22 DIAGNOSIS — R5383 Other fatigue: Secondary | ICD-10-CM | POA: Diagnosis not present

## 2017-08-22 DIAGNOSIS — Z0000 Encounter for general adult medical examination without abnormal findings: Secondary | ICD-10-CM | POA: Diagnosis not present

## 2017-08-22 NOTE — Progress Notes (Signed)
Cardiology Office Note  Date: 08/24/2017   ID: Meredith Bennett, DOB 05/08/1918, MRN [id number]  PCP: Glenda Chroman, MD  Primary Cardiologist: Rozann Lesches, MD   Chief Complaint  Patient presents with  . Atrial Fibrillation    History of Present Illness: Meredith Bennett is a 84 y.o. female last seen in March.  She is here for a follow-up visit.  She states that she feels well overall, has not had any significant palpitations on combination of Toprol-XL and Cardizem CD.  She does not report any falls or syncope.  Uses a cane.  CHADSVASC score is 3.She recalls having GI bleeding previously when on Xarelto, and is not interested in resuming anticoagulation.  Past Medical History:  Diagnosis Date  . Atrial fibrillation and flutter ([place])    Atrial flutter documented 2013  . Back pain     Past Surgical History:  Procedure Laterality Date  . BACK SURGERY     April 2011 and November 2011  . BREAST BIOPSY    . TOTAL ABDOMINAL HYSTERECTOMY W/ BILATERAL SALPINGOOPHORECTOMY      Current Outpatient Medications  Medication Sig Dispense Refill  . diltiazem (DILACOR XR) 240 MG 24 hr capsule Take 1 capsule (240 mg total) by mouth daily. 30 capsule 1  . ergocalciferol (VITAMIN D2) 50000 UNITS capsule Take 50,000 Units by mouth once a week.    . furosemide (LASIX) 40 MG tablet Take 20 mg by mouth as needed.   1  . metoprolol succinate (TOPROL-XL) 25 MG 24 hr tablet Take 1 tablet by mouth every evening. At 5:00 pm  4  . Multiple Vitamins-Minerals (EYE VITAMINS PO) Take 1 capsule by mouth 2 (two) times daily.    . potassium chloride (K-DUR) 10 MEQ tablet Take 10 mEq by mouth as needed.  2   No current facility-administered medications for this visit.    Allergies:  Fosamax [alendronate sodium] and Penicillins   Social History: The patient  reports that she has never smoked. She has never used smokeless tobacco. She reports that she does not drink alcohol or use drugs.   ROS:  Please  see the history of present illness. Otherwise, complete review of systems is positive for hearing loss.  All other systems are reviewed and negative.   Physical Exam: VS:  BP (!) 113/55   Pulse 74   Ht 5\' 2"  (1.575 m)   Wt 150 lb (68 kg)   SpO2 98%   BMI 27.44 kg/m , BMI Body mass index is 27.44 kg/m.  Wt Readings from Last 3 Encounters:  08/24/17 150 lb (68 kg)  05/18/17 147 lb (66.7 kg)  04/12/17 138 lb (62.6 kg)    General: Elderly woman, appears comfortable at rest. HEENT: Conjunctiva and lids normal, oropharynx clear. Neck: Supple, no elevated JVP or carotid bruits, no thyromegaly. Lungs: Clear to auscultation, nonlabored breathing at rest. Cardiac: Regular rate and rhythm, no S3, soft systolic murmur. Abdomen: Soft, nontender, bowel sounds present. Extremities: Mild ankle edema, distal pulses 2+.  ECG: I personally reviewed the tracing from 05/18/2017 which showed sinus rhythm with PVC, R' in lead V1 and V2.  Recent Labwork: 04/12/2017: ALT 12; AST 17; BUN 18; Creatinine, Ser 0.70; Hemoglobin 14.1; Magnesium 2.0; Platelets 231; Potassium 4.0; Sodium 137   Other Studies Reviewed Today:  Echocardiogram7/11/2011 ([facility]): Mild LVH with LVEF 36-62%, grade 2 diastolic dysfunction, mild to moderate left atrial enlargement, normal right ventricular contraction, mitralannularcalcification with mild mitral regurgitation, trace tricuspid regurgitation, PASP  29 mmHg, no pericardial effusion.  Assessment and Plan:  Paroxysmal atrial fibrillation and flutter.  She is doing well symptomatically speaking without significant palpitations on current regimen including Toprol-XL and Cardizem CD.  CHADSVASC score is 3, however she declines anticoagulation as noted above recalling previous bleeding problems on Xarelto.  Current medicines were reviewed with the patient today.  Disposition: Follow-up in 6 months.  Signed, Satira Sark, MD, [facility] 08/24/2017 2:00 PM    [place] [facility] [place], [place], [place] [address] Phone: [phone]; Fax: [phone]

## 2017-08-24 ENCOUNTER — Ambulatory Visit: Payer: Medicare HMO | Admitting: Cardiology

## 2017-08-24 ENCOUNTER — Other Ambulatory Visit: Payer: Self-pay

## 2017-08-24 ENCOUNTER — Encounter: Payer: Self-pay | Admitting: Cardiology

## 2017-08-24 VITALS — BP 113/55 | HR 74 | Ht 62.0 in | Wt 150.0 lb

## 2017-08-24 DIAGNOSIS — I4892 Unspecified atrial flutter: Secondary | ICD-10-CM | POA: Diagnosis not present

## 2017-08-24 DIAGNOSIS — I4891 Unspecified atrial fibrillation: Secondary | ICD-10-CM | POA: Diagnosis not present

## 2017-08-24 NOTE — Patient Instructions (Signed)

## 2017-09-12 DIAGNOSIS — I10 Essential (primary) hypertension: Secondary | ICD-10-CM | POA: Diagnosis not present

## 2017-09-12 DIAGNOSIS — I4891 Unspecified atrial fibrillation: Secondary | ICD-10-CM | POA: Diagnosis not present

## 2017-10-23 DIAGNOSIS — Z6826 Body mass index (BMI) 26.0-26.9, adult: Secondary | ICD-10-CM | POA: Diagnosis not present

## 2017-10-23 DIAGNOSIS — I4891 Unspecified atrial fibrillation: Secondary | ICD-10-CM | POA: Diagnosis not present

## 2017-10-23 DIAGNOSIS — R600 Localized edema: Secondary | ICD-10-CM | POA: Diagnosis not present

## 2017-10-23 DIAGNOSIS — I471 Supraventricular tachycardia: Secondary | ICD-10-CM | POA: Diagnosis not present

## 2017-10-23 DIAGNOSIS — Z299 Encounter for prophylactic measures, unspecified: Secondary | ICD-10-CM | POA: Diagnosis not present

## 2017-10-23 DIAGNOSIS — I10 Essential (primary) hypertension: Secondary | ICD-10-CM | POA: Diagnosis not present

## 2017-10-30 DIAGNOSIS — D3131 Benign neoplasm of right choroid: Secondary | ICD-10-CM | POA: Diagnosis not present

## 2017-10-30 DIAGNOSIS — H524 Presbyopia: Secondary | ICD-10-CM | POA: Diagnosis not present

## 2017-11-14 DIAGNOSIS — R69 Illness, unspecified: Secondary | ICD-10-CM | POA: Diagnosis not present

## 2017-12-17 DIAGNOSIS — Z299 Encounter for prophylactic measures, unspecified: Secondary | ICD-10-CM | POA: Diagnosis not present

## 2017-12-17 DIAGNOSIS — I10 Essential (primary) hypertension: Secondary | ICD-10-CM | POA: Diagnosis not present

## 2017-12-17 DIAGNOSIS — K219 Gastro-esophageal reflux disease without esophagitis: Secondary | ICD-10-CM | POA: Diagnosis not present

## 2017-12-17 DIAGNOSIS — Z6827 Body mass index (BMI) 27.0-27.9, adult: Secondary | ICD-10-CM | POA: Diagnosis not present

## 2017-12-17 DIAGNOSIS — J069 Acute upper respiratory infection, unspecified: Secondary | ICD-10-CM | POA: Diagnosis not present

## 2017-12-17 DIAGNOSIS — R110 Nausea: Secondary | ICD-10-CM | POA: Diagnosis not present

## 2018-01-16 DIAGNOSIS — I10 Essential (primary) hypertension: Secondary | ICD-10-CM | POA: Diagnosis not present

## 2018-01-16 DIAGNOSIS — I4891 Unspecified atrial fibrillation: Secondary | ICD-10-CM | POA: Diagnosis not present

## 2018-02-15 DIAGNOSIS — Z299 Encounter for prophylactic measures, unspecified: Secondary | ICD-10-CM | POA: Diagnosis not present

## 2018-02-15 DIAGNOSIS — K5900 Constipation, unspecified: Secondary | ICD-10-CM | POA: Diagnosis not present

## 2018-02-15 DIAGNOSIS — I4891 Unspecified atrial fibrillation: Secondary | ICD-10-CM | POA: Diagnosis not present

## 2018-02-15 DIAGNOSIS — Z6826 Body mass index (BMI) 26.0-26.9, adult: Secondary | ICD-10-CM | POA: Diagnosis not present

## 2018-02-15 DIAGNOSIS — I10 Essential (primary) hypertension: Secondary | ICD-10-CM | POA: Diagnosis not present

## 2018-02-18 DIAGNOSIS — I4891 Unspecified atrial fibrillation: Secondary | ICD-10-CM | POA: Diagnosis not present

## 2018-02-18 DIAGNOSIS — I10 Essential (primary) hypertension: Secondary | ICD-10-CM | POA: Diagnosis not present

## 2018-02-28 ENCOUNTER — Ambulatory Visit: Payer: Medicare HMO | Admitting: Cardiology

## 2018-02-28 ENCOUNTER — Encounter: Payer: Self-pay | Admitting: Cardiology

## 2018-02-28 VITALS — BP 112/60 | HR 70 | Ht 62.0 in | Wt 158.0 lb

## 2018-02-28 DIAGNOSIS — I4892 Unspecified atrial flutter: Secondary | ICD-10-CM | POA: Diagnosis not present

## 2018-02-28 DIAGNOSIS — I4891 Unspecified atrial fibrillation: Secondary | ICD-10-CM

## 2018-02-28 NOTE — Progress Notes (Signed)
Cardiology Office Note  Date: 02/28/2018   ID: Meredith Bennett, DOB Jul 25, 1918, MRN [id number]  PCP: Glenda Chroman, MD  Primary Cardiologist: Rozann Lesches, MD   Chief Complaint  Patient presents with  . Atrial Fibrillation    History of Present Illness: Meredith Bennett is a 85 y.o. female last seen in July 2019.  She presents for a routine follow-up visit.  She does not report any palpitations or chest pain.  Remarkably, she remains fairly functional in terms of basic ADLs, uses a cane, denies any recent falls.  She continues to follow with Dr. Woody Seller.  CHADSVASC score is 3.She recalls having GI bleeding previously when on Xarelto, and is not interested in resuming anticoagulation.  She remains on Dilacor XR and Toprol-XL.  Heart rate is regular today and her previous ECG from last year showed sinus rhythm.  Past Medical History:  Diagnosis Date  . Atrial fibrillation and flutter ([place])    Atrial flutter documented 2013  . Back pain     Past Surgical History:  Procedure Laterality Date  . BACK SURGERY     April 2011 and November 2011  . BREAST BIOPSY    . TOTAL ABDOMINAL HYSTERECTOMY W/ BILATERAL SALPINGOOPHORECTOMY      Current Outpatient Medications  Medication Sig Dispense Refill  . diltiazem (DILACOR XR) 240 MG 24 hr capsule Take 1 capsule (240 mg total) by mouth daily. 30 capsule 1  . ergocalciferol (VITAMIN D2) 50000 UNITS capsule Take 50,000 Units by mouth once a week.    . metoprolol succinate (TOPROL-XL) 25 MG 24 hr tablet Take 1 tablet by mouth every evening. At 5:00 pm  4  . Multiple Vitamins-Minerals (EYE VITAMINS PO) Take 1 capsule by mouth 2 (two) times daily.     No current facility-administered medications for this visit.    Allergies:  Fosamax [alendronate sodium] and Penicillins   Social History: The patient  reports that she has never smoked. She has never used smokeless tobacco. She reports that she does not drink alcohol or use drugs.   ROS:   Please see the history of present illness. Otherwise, complete review of systems is positive for hearing loss.  All other systems are reviewed and negative.   Physical Exam: VS:  BP 112/60   Pulse 70   Ht 5\' 2"  (1.575 m)   Wt 158 lb (71.7 kg)   SpO2 96%   BMI 28.90 kg/m , BMI Body mass index is 28.9 kg/m.  Wt Readings from Last 3 Encounters:  02/28/18 158 lb (71.7 kg)  08/24/17 150 lb (68 kg)  05/18/17 147 lb (66.7 kg)    General: Elderly woman, appears comfortable at rest.  Using a cane. HEENT: Conjunctiva and lids normal, oropharynx clear. Neck: Supple, no elevated JVP or carotid bruits, no thyromegaly. Lungs: Clear to auscultation, nonlabored breathing at rest. Cardiac: Regular rate and rhythm, no S3, soft systolic murmur. Abdomen: Soft, nontender, bowel sounds present. Extremities: No pitting edema, distal pulses 2+.  ECG: I personally reviewed the tracing from 05/18/2017 which showed sinus rhythm with PVC, R' in lead V1 and V2.  Recent Labwork: 04/12/2017: ALT 12; AST 17; BUN 18; Creatinine, Ser 0.70; Hemoglobin 14.1; Magnesium 2.0; Platelets 231; Potassium 4.0; Sodium 137   Other Studies Reviewed Today:  Echocardiogram7/11/2011 ([facility]): Mild LVH with LVEF 75-91%, grade 2 diastolic dysfunction, mild to moderate left atrial enlargement, normal right ventricular contraction, mitralannularcalcification with mild mitral regurgitation, trace tricuspid regurgitation, PASP 29 mmHg, no pericardial  effusion.  Assessment and Plan:  Paroxysmal atrial fibrillation and flutter.  Patient declines anticoagulation as noted above with prior history of bleeding on Xarelto.  CHADSVASC score is 3. She is tolerating Dilacor XR and Toprol-XL without significant palpitations and has been in sinus rhythm on the last few evaluations.  Keep follow-up with Dr. Woody Seller.  Current medicines were reviewed with the patient today.  Disposition: Follow-up in 1 year.  Signed, Satira Sark, MD,  [facility] 02/28/2018 2:12 PM    [place] at [place], [place], [place] [address] Phone: [phone]; Fax: [phone]

## 2018-02-28 NOTE — Patient Instructions (Signed)

## 2018-03-18 DIAGNOSIS — I10 Essential (primary) hypertension: Secondary | ICD-10-CM | POA: Diagnosis not present

## 2018-03-18 DIAGNOSIS — I4891 Unspecified atrial fibrillation: Secondary | ICD-10-CM | POA: Diagnosis not present

## 2018-04-23 DIAGNOSIS — I4891 Unspecified atrial fibrillation: Secondary | ICD-10-CM | POA: Diagnosis not present

## 2018-04-23 DIAGNOSIS — I10 Essential (primary) hypertension: Secondary | ICD-10-CM | POA: Diagnosis not present

## 2018-05-17 DIAGNOSIS — Z299 Encounter for prophylactic measures, unspecified: Secondary | ICD-10-CM | POA: Diagnosis not present

## 2018-05-17 DIAGNOSIS — I4891 Unspecified atrial fibrillation: Secondary | ICD-10-CM | POA: Diagnosis not present

## 2018-05-17 DIAGNOSIS — Z6826 Body mass index (BMI) 26.0-26.9, adult: Secondary | ICD-10-CM | POA: Diagnosis not present

## 2018-05-17 DIAGNOSIS — I10 Essential (primary) hypertension: Secondary | ICD-10-CM | POA: Diagnosis not present

## 2018-05-17 DIAGNOSIS — I471 Supraventricular tachycardia: Secondary | ICD-10-CM | POA: Diagnosis not present

## 2018-06-06 DIAGNOSIS — I4891 Unspecified atrial fibrillation: Secondary | ICD-10-CM | POA: Diagnosis not present

## 2018-06-06 DIAGNOSIS — I10 Essential (primary) hypertension: Secondary | ICD-10-CM | POA: Diagnosis not present

## 2018-06-27 DIAGNOSIS — M545 Low back pain: Secondary | ICD-10-CM | POA: Diagnosis not present

## 2018-06-27 DIAGNOSIS — M810 Age-related osteoporosis without current pathological fracture: Secondary | ICD-10-CM | POA: Diagnosis not present

## 2018-06-27 DIAGNOSIS — Z6826 Body mass index (BMI) 26.0-26.9, adult: Secondary | ICD-10-CM | POA: Diagnosis not present

## 2018-06-27 DIAGNOSIS — I10 Essential (primary) hypertension: Secondary | ICD-10-CM | POA: Diagnosis not present

## 2018-06-27 DIAGNOSIS — Z299 Encounter for prophylactic measures, unspecified: Secondary | ICD-10-CM | POA: Diagnosis not present

## 2018-06-27 DIAGNOSIS — I4891 Unspecified atrial fibrillation: Secondary | ICD-10-CM | POA: Diagnosis not present

## 2018-06-28 DIAGNOSIS — Z299 Encounter for prophylactic measures, unspecified: Secondary | ICD-10-CM | POA: Diagnosis not present

## 2018-06-28 DIAGNOSIS — I4891 Unspecified atrial fibrillation: Secondary | ICD-10-CM | POA: Diagnosis not present

## 2018-06-28 DIAGNOSIS — M160 Bilateral primary osteoarthritis of hip: Secondary | ICD-10-CM | POA: Diagnosis not present

## 2018-06-28 DIAGNOSIS — I10 Essential (primary) hypertension: Secondary | ICD-10-CM | POA: Diagnosis not present

## 2018-06-28 DIAGNOSIS — M4805 Spinal stenosis, thoracolumbar region: Secondary | ICD-10-CM | POA: Diagnosis not present

## 2018-06-28 DIAGNOSIS — M47816 Spondylosis without myelopathy or radiculopathy, lumbar region: Secondary | ICD-10-CM | POA: Diagnosis not present

## 2018-06-28 DIAGNOSIS — M810 Age-related osteoporosis without current pathological fracture: Secondary | ICD-10-CM | POA: Diagnosis not present

## 2018-06-28 DIAGNOSIS — Z981 Arthrodesis status: Secondary | ICD-10-CM | POA: Diagnosis not present

## 2018-06-28 DIAGNOSIS — Z6826 Body mass index (BMI) 26.0-26.9, adult: Secondary | ICD-10-CM | POA: Diagnosis not present

## 2018-06-28 DIAGNOSIS — M461 Sacroiliitis, not elsewhere classified: Secondary | ICD-10-CM | POA: Diagnosis not present

## 2018-07-08 DIAGNOSIS — I4891 Unspecified atrial fibrillation: Secondary | ICD-10-CM | POA: Diagnosis not present

## 2018-07-08 DIAGNOSIS — I10 Essential (primary) hypertension: Secondary | ICD-10-CM | POA: Diagnosis not present

## 2018-08-05 DIAGNOSIS — R69 Illness, unspecified: Secondary | ICD-10-CM | POA: Diagnosis not present

## 2018-08-20 DIAGNOSIS — Z299 Encounter for prophylactic measures, unspecified: Secondary | ICD-10-CM | POA: Diagnosis not present

## 2018-08-20 DIAGNOSIS — I4891 Unspecified atrial fibrillation: Secondary | ICD-10-CM | POA: Diagnosis not present

## 2018-08-20 DIAGNOSIS — Z6825 Body mass index (BMI) 25.0-25.9, adult: Secondary | ICD-10-CM | POA: Diagnosis not present

## 2018-08-20 DIAGNOSIS — Z789 Other specified health status: Secondary | ICD-10-CM | POA: Diagnosis not present

## 2018-08-20 DIAGNOSIS — I471 Supraventricular tachycardia: Secondary | ICD-10-CM | POA: Diagnosis not present

## 2018-08-20 DIAGNOSIS — M461 Sacroiliitis, not elsewhere classified: Secondary | ICD-10-CM | POA: Diagnosis not present

## 2018-08-20 DIAGNOSIS — I10 Essential (primary) hypertension: Secondary | ICD-10-CM | POA: Diagnosis not present

## 2018-08-26 DIAGNOSIS — Z7189 Other specified counseling: Secondary | ICD-10-CM | POA: Diagnosis not present

## 2018-08-26 DIAGNOSIS — E7800 Pure hypercholesterolemia, unspecified: Secondary | ICD-10-CM | POA: Diagnosis not present

## 2018-08-26 DIAGNOSIS — Z6825 Body mass index (BMI) 25.0-25.9, adult: Secondary | ICD-10-CM | POA: Diagnosis not present

## 2018-08-26 DIAGNOSIS — I10 Essential (primary) hypertension: Secondary | ICD-10-CM | POA: Diagnosis not present

## 2018-08-26 DIAGNOSIS — Z0000 Encounter for general adult medical examination without abnormal findings: Secondary | ICD-10-CM | POA: Diagnosis not present

## 2018-08-26 DIAGNOSIS — Z1339 Encounter for screening examination for other mental health and behavioral disorders: Secondary | ICD-10-CM | POA: Diagnosis not present

## 2018-08-26 DIAGNOSIS — Z1211 Encounter for screening for malignant neoplasm of colon: Secondary | ICD-10-CM | POA: Diagnosis not present

## 2018-08-26 DIAGNOSIS — R5383 Other fatigue: Secondary | ICD-10-CM | POA: Diagnosis not present

## 2018-08-26 DIAGNOSIS — Z79899 Other long term (current) drug therapy: Secondary | ICD-10-CM | POA: Diagnosis not present

## 2018-08-26 DIAGNOSIS — Z1331 Encounter for screening for depression: Secondary | ICD-10-CM | POA: Diagnosis not present

## 2018-08-26 DIAGNOSIS — Z299 Encounter for prophylactic measures, unspecified: Secondary | ICD-10-CM | POA: Diagnosis not present

## 2018-08-28 DIAGNOSIS — R69 Illness, unspecified: Secondary | ICD-10-CM | POA: Diagnosis not present

## 2018-09-05 DIAGNOSIS — I10 Essential (primary) hypertension: Secondary | ICD-10-CM | POA: Diagnosis not present

## 2018-09-05 DIAGNOSIS — I4891 Unspecified atrial fibrillation: Secondary | ICD-10-CM | POA: Diagnosis not present

## 2018-09-05 DIAGNOSIS — Z299 Encounter for prophylactic measures, unspecified: Secondary | ICD-10-CM | POA: Diagnosis not present

## 2018-09-05 DIAGNOSIS — R609 Edema, unspecified: Secondary | ICD-10-CM | POA: Diagnosis not present

## 2018-09-05 DIAGNOSIS — Z6825 Body mass index (BMI) 25.0-25.9, adult: Secondary | ICD-10-CM | POA: Diagnosis not present

## 2018-09-05 DIAGNOSIS — Z87891 Personal history of nicotine dependence: Secondary | ICD-10-CM | POA: Diagnosis not present

## 2018-09-05 DIAGNOSIS — M461 Sacroiliitis, not elsewhere classified: Secondary | ICD-10-CM | POA: Diagnosis not present

## 2018-09-05 DIAGNOSIS — I471 Supraventricular tachycardia: Secondary | ICD-10-CM | POA: Diagnosis not present

## 2018-09-09 DIAGNOSIS — R5383 Other fatigue: Secondary | ICD-10-CM | POA: Diagnosis not present

## 2018-09-11 DIAGNOSIS — I4891 Unspecified atrial fibrillation: Secondary | ICD-10-CM | POA: Diagnosis not present

## 2018-09-11 DIAGNOSIS — I10 Essential (primary) hypertension: Secondary | ICD-10-CM | POA: Diagnosis not present

## 2018-10-29 DIAGNOSIS — R600 Localized edema: Secondary | ICD-10-CM | POA: Diagnosis not present

## 2018-10-29 DIAGNOSIS — Z299 Encounter for prophylactic measures, unspecified: Secondary | ICD-10-CM | POA: Diagnosis not present

## 2018-10-29 DIAGNOSIS — I471 Supraventricular tachycardia: Secondary | ICD-10-CM | POA: Diagnosis not present

## 2018-10-29 DIAGNOSIS — E7800 Pure hypercholesterolemia, unspecified: Secondary | ICD-10-CM | POA: Diagnosis not present

## 2018-10-29 DIAGNOSIS — I10 Essential (primary) hypertension: Secondary | ICD-10-CM | POA: Diagnosis not present

## 2018-10-29 DIAGNOSIS — I4891 Unspecified atrial fibrillation: Secondary | ICD-10-CM | POA: Diagnosis not present

## 2018-10-29 DIAGNOSIS — Z6826 Body mass index (BMI) 26.0-26.9, adult: Secondary | ICD-10-CM | POA: Diagnosis not present

## 2018-11-04 DIAGNOSIS — R69 Illness, unspecified: Secondary | ICD-10-CM | POA: Diagnosis not present

## 2018-12-05 DIAGNOSIS — I10 Essential (primary) hypertension: Secondary | ICD-10-CM | POA: Diagnosis not present

## 2018-12-05 DIAGNOSIS — M810 Age-related osteoporosis without current pathological fracture: Secondary | ICD-10-CM | POA: Diagnosis not present

## 2018-12-05 DIAGNOSIS — Z299 Encounter for prophylactic measures, unspecified: Secondary | ICD-10-CM | POA: Diagnosis not present

## 2018-12-05 DIAGNOSIS — R609 Edema, unspecified: Secondary | ICD-10-CM | POA: Diagnosis not present

## 2018-12-05 DIAGNOSIS — Z6826 Body mass index (BMI) 26.0-26.9, adult: Secondary | ICD-10-CM | POA: Diagnosis not present

## 2018-12-12 DIAGNOSIS — I471 Supraventricular tachycardia: Secondary | ICD-10-CM | POA: Diagnosis not present

## 2018-12-12 DIAGNOSIS — I10 Essential (primary) hypertension: Secondary | ICD-10-CM | POA: Diagnosis not present

## 2018-12-12 DIAGNOSIS — R609 Edema, unspecified: Secondary | ICD-10-CM | POA: Diagnosis not present

## 2018-12-12 DIAGNOSIS — N183 Chronic kidney disease, stage 3 unspecified: Secondary | ICD-10-CM | POA: Diagnosis not present

## 2018-12-12 DIAGNOSIS — I4891 Unspecified atrial fibrillation: Secondary | ICD-10-CM | POA: Diagnosis not present

## 2018-12-12 DIAGNOSIS — Z299 Encounter for prophylactic measures, unspecified: Secondary | ICD-10-CM | POA: Diagnosis not present

## 2018-12-12 DIAGNOSIS — Z6827 Body mass index (BMI) 27.0-27.9, adult: Secondary | ICD-10-CM | POA: Diagnosis not present

## 2018-12-17 DIAGNOSIS — I10 Essential (primary) hypertension: Secondary | ICD-10-CM | POA: Diagnosis not present

## 2018-12-17 DIAGNOSIS — Z6827 Body mass index (BMI) 27.0-27.9, adult: Secondary | ICD-10-CM | POA: Diagnosis not present

## 2018-12-17 DIAGNOSIS — R29898 Other symptoms and signs involving the musculoskeletal system: Secondary | ICD-10-CM | POA: Diagnosis not present

## 2018-12-17 DIAGNOSIS — Z299 Encounter for prophylactic measures, unspecified: Secondary | ICD-10-CM | POA: Diagnosis not present

## 2018-12-17 DIAGNOSIS — I4891 Unspecified atrial fibrillation: Secondary | ICD-10-CM | POA: Diagnosis not present

## 2018-12-17 DIAGNOSIS — R600 Localized edema: Secondary | ICD-10-CM | POA: Diagnosis not present

## 2018-12-27 DIAGNOSIS — Z6827 Body mass index (BMI) 27.0-27.9, adult: Secondary | ICD-10-CM | POA: Diagnosis not present

## 2018-12-27 DIAGNOSIS — I10 Essential (primary) hypertension: Secondary | ICD-10-CM | POA: Diagnosis not present

## 2018-12-27 DIAGNOSIS — I4891 Unspecified atrial fibrillation: Secondary | ICD-10-CM | POA: Diagnosis not present

## 2018-12-27 DIAGNOSIS — I471 Supraventricular tachycardia: Secondary | ICD-10-CM | POA: Diagnosis not present

## 2018-12-27 DIAGNOSIS — Z299 Encounter for prophylactic measures, unspecified: Secondary | ICD-10-CM | POA: Diagnosis not present

## 2018-12-27 DIAGNOSIS — R600 Localized edema: Secondary | ICD-10-CM | POA: Diagnosis not present

## 2018-12-30 DIAGNOSIS — I10 Essential (primary) hypertension: Secondary | ICD-10-CM | POA: Diagnosis not present

## 2018-12-30 DIAGNOSIS — I4891 Unspecified atrial fibrillation: Secondary | ICD-10-CM | POA: Diagnosis not present

## 2019-01-10 DIAGNOSIS — R609 Edema, unspecified: Secondary | ICD-10-CM | POA: Diagnosis not present

## 2019-01-10 DIAGNOSIS — I4891 Unspecified atrial fibrillation: Secondary | ICD-10-CM | POA: Diagnosis not present

## 2019-01-10 DIAGNOSIS — I471 Supraventricular tachycardia: Secondary | ICD-10-CM | POA: Diagnosis not present

## 2019-01-10 DIAGNOSIS — Z299 Encounter for prophylactic measures, unspecified: Secondary | ICD-10-CM | POA: Diagnosis not present

## 2019-01-10 DIAGNOSIS — Z6827 Body mass index (BMI) 27.0-27.9, adult: Secondary | ICD-10-CM | POA: Diagnosis not present

## 2019-01-10 DIAGNOSIS — I10 Essential (primary) hypertension: Secondary | ICD-10-CM | POA: Diagnosis not present

## 2019-01-13 DIAGNOSIS — R600 Localized edema: Secondary | ICD-10-CM | POA: Diagnosis not present

## 2019-01-28 DIAGNOSIS — K219 Gastro-esophageal reflux disease without esophagitis: Secondary | ICD-10-CM | POA: Diagnosis not present

## 2019-01-28 DIAGNOSIS — Z743 Need for continuous supervision: Secondary | ICD-10-CM | POA: Diagnosis not present

## 2019-01-28 DIAGNOSIS — W19XXXA Unspecified fall, initial encounter: Secondary | ICD-10-CM | POA: Diagnosis not present

## 2019-01-28 DIAGNOSIS — S0003XA Contusion of scalp, initial encounter: Secondary | ICD-10-CM | POA: Diagnosis not present

## 2019-01-28 DIAGNOSIS — E876 Hypokalemia: Secondary | ICD-10-CM | POA: Diagnosis not present

## 2019-01-28 DIAGNOSIS — J90 Pleural effusion, not elsewhere classified: Secondary | ICD-10-CM | POA: Diagnosis not present

## 2019-01-28 DIAGNOSIS — R262 Difficulty in walking, not elsewhere classified: Secondary | ICD-10-CM | POA: Diagnosis not present

## 2019-01-28 DIAGNOSIS — N390 Urinary tract infection, site not specified: Secondary | ICD-10-CM | POA: Diagnosis not present

## 2019-01-28 DIAGNOSIS — I5033 Acute on chronic diastolic (congestive) heart failure: Secondary | ICD-10-CM | POA: Diagnosis not present

## 2019-01-28 DIAGNOSIS — I509 Heart failure, unspecified: Secondary | ICD-10-CM | POA: Diagnosis not present

## 2019-01-28 DIAGNOSIS — I471 Supraventricular tachycardia: Secondary | ICD-10-CM | POA: Diagnosis not present

## 2019-01-28 DIAGNOSIS — S3993XA Unspecified injury of pelvis, initial encounter: Secondary | ICD-10-CM | POA: Diagnosis not present

## 2019-01-28 DIAGNOSIS — I480 Paroxysmal atrial fibrillation: Secondary | ICD-10-CM | POA: Diagnosis not present

## 2019-01-28 DIAGNOSIS — I4891 Unspecified atrial fibrillation: Secondary | ICD-10-CM | POA: Diagnosis not present

## 2019-01-28 DIAGNOSIS — M545 Low back pain: Secondary | ICD-10-CM | POA: Diagnosis not present

## 2019-01-28 DIAGNOSIS — R52 Pain, unspecified: Secondary | ICD-10-CM | POA: Diagnosis not present

## 2019-01-28 DIAGNOSIS — J9601 Acute respiratory failure with hypoxia: Secondary | ICD-10-CM | POA: Diagnosis not present

## 2019-01-28 DIAGNOSIS — U071 COVID-19: Secondary | ICD-10-CM | POA: Diagnosis not present

## 2019-01-28 DIAGNOSIS — J189 Pneumonia, unspecified organism: Secondary | ICD-10-CM | POA: Diagnosis not present

## 2019-01-28 DIAGNOSIS — M2681 Anterior soft tissue impingement: Secondary | ICD-10-CM | POA: Diagnosis not present

## 2019-01-28 DIAGNOSIS — J1289 Other viral pneumonia: Secondary | ICD-10-CM | POA: Diagnosis not present

## 2019-01-28 DIAGNOSIS — I110 Hypertensive heart disease with heart failure: Secondary | ICD-10-CM | POA: Diagnosis not present

## 2019-01-28 DIAGNOSIS — M1990 Unspecified osteoarthritis, unspecified site: Secondary | ICD-10-CM | POA: Diagnosis not present

## 2019-01-28 DIAGNOSIS — E871 Hypo-osmolality and hyponatremia: Secondary | ICD-10-CM | POA: Diagnosis not present

## 2019-01-28 DIAGNOSIS — I10 Essential (primary) hypertension: Secondary | ICD-10-CM | POA: Diagnosis not present

## 2019-01-28 DIAGNOSIS — I5031 Acute diastolic (congestive) heart failure: Secondary | ICD-10-CM | POA: Diagnosis not present

## 2019-01-28 DIAGNOSIS — R0902 Hypoxemia: Secondary | ICD-10-CM | POA: Diagnosis not present

## 2019-01-28 DIAGNOSIS — Z209 Contact with and (suspected) exposure to unspecified communicable disease: Secondary | ICD-10-CM | POA: Diagnosis not present

## 2019-01-28 DIAGNOSIS — R000 Tachycardia, unspecified: Secondary | ICD-10-CM | POA: Diagnosis not present

## 2019-02-05 DIAGNOSIS — I5033 Acute on chronic diastolic (congestive) heart failure: Secondary | ICD-10-CM | POA: Diagnosis not present

## 2019-02-05 DIAGNOSIS — U071 COVID-19: Secondary | ICD-10-CM | POA: Diagnosis not present

## 2019-02-05 DIAGNOSIS — M2681 Anterior soft tissue impingement: Secondary | ICD-10-CM | POA: Diagnosis not present

## 2019-02-05 DIAGNOSIS — J9601 Acute respiratory failure with hypoxia: Secondary | ICD-10-CM | POA: Diagnosis not present

## 2019-02-05 DIAGNOSIS — Z743 Need for continuous supervision: Secondary | ICD-10-CM | POA: Diagnosis not present

## 2019-02-05 DIAGNOSIS — K219 Gastro-esophageal reflux disease without esophagitis: Secondary | ICD-10-CM | POA: Diagnosis not present

## 2019-02-05 DIAGNOSIS — J1289 Other viral pneumonia: Secondary | ICD-10-CM | POA: Diagnosis not present

## 2019-02-05 DIAGNOSIS — N390 Urinary tract infection, site not specified: Secondary | ICD-10-CM | POA: Diagnosis not present

## 2019-02-05 DIAGNOSIS — D519 Vitamin B12 deficiency anemia, unspecified: Secondary | ICD-10-CM | POA: Diagnosis not present

## 2019-02-05 DIAGNOSIS — M1990 Unspecified osteoarthritis, unspecified site: Secondary | ICD-10-CM | POA: Diagnosis not present

## 2019-02-05 DIAGNOSIS — J189 Pneumonia, unspecified organism: Secondary | ICD-10-CM | POA: Diagnosis not present

## 2019-02-05 DIAGNOSIS — I4891 Unspecified atrial fibrillation: Secondary | ICD-10-CM | POA: Diagnosis not present

## 2019-02-05 DIAGNOSIS — D649 Anemia, unspecified: Secondary | ICD-10-CM | POA: Diagnosis not present

## 2019-02-05 DIAGNOSIS — R262 Difficulty in walking, not elsewhere classified: Secondary | ICD-10-CM | POA: Diagnosis not present

## 2019-02-05 DIAGNOSIS — E876 Hypokalemia: Secondary | ICD-10-CM | POA: Diagnosis not present

## 2019-02-05 DIAGNOSIS — I10 Essential (primary) hypertension: Secondary | ICD-10-CM | POA: Diagnosis not present

## 2019-02-05 DIAGNOSIS — R6889 Other general symptoms and signs: Secondary | ICD-10-CM | POA: Diagnosis not present

## 2019-02-05 DIAGNOSIS — Z209 Contact with and (suspected) exposure to unspecified communicable disease: Secondary | ICD-10-CM | POA: Diagnosis not present

## 2019-02-06 DIAGNOSIS — I10 Essential (primary) hypertension: Secondary | ICD-10-CM | POA: Diagnosis not present

## 2019-02-06 DIAGNOSIS — I4891 Unspecified atrial fibrillation: Secondary | ICD-10-CM | POA: Diagnosis not present

## 2019-02-06 DIAGNOSIS — U071 COVID-19: Secondary | ICD-10-CM | POA: Diagnosis not present

## 2019-02-06 DIAGNOSIS — I5033 Acute on chronic diastolic (congestive) heart failure: Secondary | ICD-10-CM | POA: Diagnosis not present

## 2019-03-01 DIAGNOSIS — I5033 Acute on chronic diastolic (congestive) heart failure: Secondary | ICD-10-CM | POA: Diagnosis not present

## 2019-03-01 DIAGNOSIS — U071 COVID-19: Secondary | ICD-10-CM | POA: Diagnosis not present

## 2019-03-03 DIAGNOSIS — J189 Pneumonia, unspecified organism: Secondary | ICD-10-CM | POA: Diagnosis not present

## 2019-03-03 DIAGNOSIS — U071 COVID-19: Secondary | ICD-10-CM | POA: Diagnosis not present

## 2019-03-03 DIAGNOSIS — M810 Age-related osteoporosis without current pathological fracture: Secondary | ICD-10-CM | POA: Diagnosis not present

## 2019-03-03 DIAGNOSIS — I5033 Acute on chronic diastolic (congestive) heart failure: Secondary | ICD-10-CM | POA: Diagnosis not present

## 2019-03-03 DIAGNOSIS — I110 Hypertensive heart disease with heart failure: Secondary | ICD-10-CM | POA: Diagnosis not present

## 2019-03-03 DIAGNOSIS — I051 Rheumatic mitral insufficiency: Secondary | ICD-10-CM | POA: Diagnosis not present

## 2019-03-03 DIAGNOSIS — Z9981 Dependence on supplemental oxygen: Secondary | ICD-10-CM | POA: Diagnosis not present

## 2019-03-03 DIAGNOSIS — I4891 Unspecified atrial fibrillation: Secondary | ICD-10-CM | POA: Diagnosis not present

## 2019-03-03 DIAGNOSIS — K219 Gastro-esophageal reflux disease without esophagitis: Secondary | ICD-10-CM | POA: Diagnosis not present

## 2019-03-04 DIAGNOSIS — Z6827 Body mass index (BMI) 27.0-27.9, adult: Secondary | ICD-10-CM | POA: Diagnosis not present

## 2019-03-04 DIAGNOSIS — I110 Hypertensive heart disease with heart failure: Secondary | ICD-10-CM | POA: Diagnosis not present

## 2019-03-04 DIAGNOSIS — Z9981 Dependence on supplemental oxygen: Secondary | ICD-10-CM | POA: Diagnosis not present

## 2019-03-04 DIAGNOSIS — I4891 Unspecified atrial fibrillation: Secondary | ICD-10-CM | POA: Diagnosis not present

## 2019-03-04 DIAGNOSIS — J189 Pneumonia, unspecified organism: Secondary | ICD-10-CM | POA: Diagnosis not present

## 2019-03-04 DIAGNOSIS — M810 Age-related osteoporosis without current pathological fracture: Secondary | ICD-10-CM | POA: Diagnosis not present

## 2019-03-04 DIAGNOSIS — J9611 Chronic respiratory failure with hypoxia: Secondary | ICD-10-CM | POA: Diagnosis not present

## 2019-03-04 DIAGNOSIS — I471 Supraventricular tachycardia: Secondary | ICD-10-CM | POA: Diagnosis not present

## 2019-03-04 DIAGNOSIS — U071 COVID-19: Secondary | ICD-10-CM | POA: Diagnosis not present

## 2019-03-04 DIAGNOSIS — K219 Gastro-esophageal reflux disease without esophagitis: Secondary | ICD-10-CM | POA: Diagnosis not present

## 2019-03-04 DIAGNOSIS — I5033 Acute on chronic diastolic (congestive) heart failure: Secondary | ICD-10-CM | POA: Diagnosis not present

## 2019-03-04 DIAGNOSIS — Z299 Encounter for prophylactic measures, unspecified: Secondary | ICD-10-CM | POA: Diagnosis not present

## 2019-03-04 DIAGNOSIS — I051 Rheumatic mitral insufficiency: Secondary | ICD-10-CM | POA: Diagnosis not present

## 2019-03-05 DIAGNOSIS — M810 Age-related osteoporosis without current pathological fracture: Secondary | ICD-10-CM | POA: Diagnosis not present

## 2019-03-05 DIAGNOSIS — U071 COVID-19: Secondary | ICD-10-CM | POA: Diagnosis not present

## 2019-03-05 DIAGNOSIS — I5033 Acute on chronic diastolic (congestive) heart failure: Secondary | ICD-10-CM | POA: Diagnosis not present

## 2019-03-05 DIAGNOSIS — I110 Hypertensive heart disease with heart failure: Secondary | ICD-10-CM | POA: Diagnosis not present

## 2019-03-05 DIAGNOSIS — I4891 Unspecified atrial fibrillation: Secondary | ICD-10-CM | POA: Diagnosis not present

## 2019-03-05 DIAGNOSIS — K219 Gastro-esophageal reflux disease without esophagitis: Secondary | ICD-10-CM | POA: Diagnosis not present

## 2019-03-05 DIAGNOSIS — Z9981 Dependence on supplemental oxygen: Secondary | ICD-10-CM | POA: Diagnosis not present

## 2019-03-05 DIAGNOSIS — I051 Rheumatic mitral insufficiency: Secondary | ICD-10-CM | POA: Diagnosis not present

## 2019-03-05 DIAGNOSIS — J189 Pneumonia, unspecified organism: Secondary | ICD-10-CM | POA: Diagnosis not present

## 2019-03-06 ENCOUNTER — Encounter: Payer: Self-pay | Admitting: Cardiology

## 2019-03-06 ENCOUNTER — Telehealth: Payer: Self-pay | Admitting: Cardiology

## 2019-03-06 DIAGNOSIS — K219 Gastro-esophageal reflux disease without esophagitis: Secondary | ICD-10-CM | POA: Diagnosis not present

## 2019-03-06 DIAGNOSIS — I5033 Acute on chronic diastolic (congestive) heart failure: Secondary | ICD-10-CM | POA: Diagnosis not present

## 2019-03-06 DIAGNOSIS — U071 COVID-19: Secondary | ICD-10-CM | POA: Diagnosis not present

## 2019-03-06 DIAGNOSIS — Z9981 Dependence on supplemental oxygen: Secondary | ICD-10-CM | POA: Diagnosis not present

## 2019-03-06 DIAGNOSIS — J189 Pneumonia, unspecified organism: Secondary | ICD-10-CM | POA: Diagnosis not present

## 2019-03-06 DIAGNOSIS — I051 Rheumatic mitral insufficiency: Secondary | ICD-10-CM | POA: Diagnosis not present

## 2019-03-06 DIAGNOSIS — I4891 Unspecified atrial fibrillation: Secondary | ICD-10-CM | POA: Diagnosis not present

## 2019-03-06 DIAGNOSIS — I110 Hypertensive heart disease with heart failure: Secondary | ICD-10-CM | POA: Diagnosis not present

## 2019-03-06 DIAGNOSIS — M810 Age-related osteoporosis without current pathological fracture: Secondary | ICD-10-CM | POA: Diagnosis not present

## 2019-03-06 NOTE — Telephone Encounter (Signed)

## 2019-03-08 NOTE — Progress Notes (Signed)
Virtual Visit via Video Note   This visit type was conducted due to national recommendations for restrictions regarding the COVID-19 Pandemic (e.g. social distancing) in an effort to limit this patient's exposure and mitigate transmission in our community.  Due to her co-morbid illnesses, this patient is at least at moderate risk for complications without adequate follow up.  This format is felt to be most appropriate for this patient at this time.  All issues noted in this document were discussed and addressed.  A limited physical exam was performed with this format.  Please refer to the patient's chart for her consent to telehealth for [facility].   Date:  03/10/2019   ID:  Unk Pinto, DOB 1919/02/14, MRN [id number]  Patient Location: Home Provider Location: Office  PCP:  Glenda Chroman, MD  Cardiologist:  Rozann Lesches, MD Electrophysiologist:  None   Evaluation Performed:  Follow-Up Visit  Chief Complaint:   Cardiac follow-up  History of Present Illness:    Meredith Bennett is a 86 y.o. female last seen in January 2020.  We communicated by video conferencing today.  She was at her home with her only son and also a granddaughter present.  She is very hard of hearing, I communicated to her through her granddaughter.  She was admitted to [facility] back in December 2020 with shortness of breath in association with hypoxic respiratory failure and rapid atrial fibrillation.  She was diagnosed with COVID-19 pneumonia and medically managed.  She was discharged to the [facility], ultimately came back home and has been slowly improving.  Appetite reportedly getting better.  She uses a walker, no recent falls.  Still using oxygen at nighttime but hopes to wean off.  Patient's son would like to take her back to [place] with him.  CHA2DS2-VASc score is 3.She recalls having GI bleeding previously when on Xarelto, and is not interested in resuming anticoagulation.  Chest x-ray  from [facility] back in December 2020 showed bilateral pulmonary infiltrates with small effusions.  She has home health nursing checking in on her a few times a week.  I reviewed her medications.  She remains on Toprol-XL and Cardizem CD with good heart rate control today.   Past Medical History:  Diagnosis Date  . Atrial fibrillation and flutter ([place])    Atrial flutter documented 2013  . Back pain    Past Surgical History:  Procedure Laterality Date  . BACK SURGERY     April 2011 and November 2011  . BREAST BIOPSY    . TOTAL ABDOMINAL HYSTERECTOMY W/ BILATERAL SALPINGOOPHORECTOMY       Current Meds  Medication Sig  . diltiazem (DILACOR XR) 180 MG 24 hr capsule Take 180 mg by mouth daily.  . ergocalciferol (VITAMIN D2) 50000 UNITS capsule Take 50,000 Units by mouth once a week.  . metoprolol succinate (TOPROL-XL) 50 MG 24 hr tablet Take 50 mg by mouth daily. Take with or immediately following a meal.  . Multiple Vitamins-Minerals (EYE VITAMINS PO) Take 1 capsule by mouth 2 (two) times daily.     Allergies:   Fosamax [alendronate sodium] and Penicillins   Social History   Tobacco Use  . Smoking status: Never Smoker  . Smokeless tobacco: Never Used  Substance Use Topics  . Alcohol use: No  . Drug use: No     Family Hx: The patient's family history includes Colon cancer in her father; Hypertension in her mother.  ROS:   Please see the  history of present illness.    Chronic hearing loss.  Gait instability. All other systems reviewed and are negative.   Prior CV studies:   The following studies were reviewed today:  Echocardiogram7/11/2011 [facility]): Mild LVH with LVEF 123456, grade 2 diastolic dysfunction, mild to moderate left atrial enlargement, normal right ventricular contraction, mitralannularcalcification with mild mitral regurgitation, trace tricuspid regurgitation, PASP 29 mmHg, no pericardial effusion.  Labs/Other Tests and Data Reviewed:    EKG:   An ECG dated 01/28/2019 was personally reviewed today and demonstrated:  Atrial fibrillation with RVR, low voltage, rightward axis, nonspecific ST-T changes.  Recent Labs:  December 2020: Hemoglobin 14.6, platelets 189, BUN 29, creatinine 0.88  Wt Readings from Last 3 Encounters:  03/10/19 110 lb (49.9 kg)  02/28/18 158 lb (71.7 kg)  08/24/17 150 lb (68 kg)     Objective:    Vital Signs:  Pulse 72   Ht 5\' 2"  (1.575 m)   Wt 110 lb (49.9 kg)   SpO2 93%   BMI 20.12 kg/m    General: Elderly woman seated and in no distress. HEENT: Conjunctiva and lids normal. Lungs: Patient spoke without obvious wheezing or coughing. Skin: Pale. Neuropsychiatric: Gaze conjugate.  Speech pattern appropriate.  Hearing loss evident.  Patient observed to move upper extremities while being seated.  ASSESSMENT & PLAN:    1.  Persistent atrial fibrillation/flutter.  CHA2DS2-VASc score is 3, she is not anticoagulated with previous history of GI bleeding on Xarelto.  Heart rate is adequately controlled today on combination of Toprol-XL and Cardizem CD, no changes made.  I reviewed recent records from [facility].  2.  Recent COVID-19 pneumonia, recuperating and back at home.  Decreasing oxygen requirement, hopes to wean off completely.  Keep follow-up with Dr. Woody Seller.  COVID-19 Education: The signs and symptoms of COVID-19 were discussed with the patient and how to seek care for testing (follow up with PCP or arrange E-visit).  The importance of social distancing was discussed today.  Time:   Today, I have spent 10 minutes with the patient with telehealth technology discussing the above problems.     Medication Adjustments/Labs and Tests Ordered: Current medicines are reviewed at length with the patient today.  Concerns regarding medicines are outlined above.   Tests Ordered: No orders of the defined types were placed in this encounter.   Medication Changes: No orders of the defined types were  placed in this encounter.   Follow Up:  Either In Person or Virtual 6 months in the [place] office.  Signed, Rozann Lesches, MD  03/10/2019 11:44 AM    [place]

## 2019-03-10 ENCOUNTER — Encounter: Payer: Self-pay | Admitting: Cardiology

## 2019-03-10 ENCOUNTER — Telehealth (INDEPENDENT_AMBULATORY_CARE_PROVIDER_SITE_OTHER): Payer: Medicare HMO | Admitting: Cardiology

## 2019-03-10 VITALS — HR 72 | Ht 62.0 in | Wt 110.0 lb

## 2019-03-10 DIAGNOSIS — J1282 Pneumonia due to coronavirus disease 2019: Secondary | ICD-10-CM | POA: Diagnosis not present

## 2019-03-10 DIAGNOSIS — U071 COVID-19: Secondary | ICD-10-CM

## 2019-03-10 DIAGNOSIS — I4892 Unspecified atrial flutter: Secondary | ICD-10-CM

## 2019-03-10 DIAGNOSIS — I4891 Unspecified atrial fibrillation: Secondary | ICD-10-CM | POA: Diagnosis not present

## 2019-03-10 NOTE — Patient Instructions (Signed)

## 2019-03-11 DIAGNOSIS — I051 Rheumatic mitral insufficiency: Secondary | ICD-10-CM | POA: Diagnosis not present

## 2019-03-11 DIAGNOSIS — K219 Gastro-esophageal reflux disease without esophagitis: Secondary | ICD-10-CM | POA: Diagnosis not present

## 2019-03-11 DIAGNOSIS — M810 Age-related osteoporosis without current pathological fracture: Secondary | ICD-10-CM | POA: Diagnosis not present

## 2019-03-11 DIAGNOSIS — Z9981 Dependence on supplemental oxygen: Secondary | ICD-10-CM | POA: Diagnosis not present

## 2019-03-11 DIAGNOSIS — I110 Hypertensive heart disease with heart failure: Secondary | ICD-10-CM | POA: Diagnosis not present

## 2019-03-11 DIAGNOSIS — J189 Pneumonia, unspecified organism: Secondary | ICD-10-CM | POA: Diagnosis not present

## 2019-03-11 DIAGNOSIS — I5033 Acute on chronic diastolic (congestive) heart failure: Secondary | ICD-10-CM | POA: Diagnosis not present

## 2019-03-11 DIAGNOSIS — U071 COVID-19: Secondary | ICD-10-CM | POA: Diagnosis not present

## 2019-03-11 DIAGNOSIS — I4891 Unspecified atrial fibrillation: Secondary | ICD-10-CM | POA: Diagnosis not present

## 2019-03-12 DIAGNOSIS — M810 Age-related osteoporosis without current pathological fracture: Secondary | ICD-10-CM | POA: Diagnosis not present

## 2019-03-12 DIAGNOSIS — K219 Gastro-esophageal reflux disease without esophagitis: Secondary | ICD-10-CM | POA: Diagnosis not present

## 2019-03-12 DIAGNOSIS — Z9981 Dependence on supplemental oxygen: Secondary | ICD-10-CM | POA: Diagnosis not present

## 2019-03-12 DIAGNOSIS — J189 Pneumonia, unspecified organism: Secondary | ICD-10-CM | POA: Diagnosis not present

## 2019-03-12 DIAGNOSIS — U071 COVID-19: Secondary | ICD-10-CM | POA: Diagnosis not present

## 2019-03-12 DIAGNOSIS — I5033 Acute on chronic diastolic (congestive) heart failure: Secondary | ICD-10-CM | POA: Diagnosis not present

## 2019-03-12 DIAGNOSIS — I110 Hypertensive heart disease with heart failure: Secondary | ICD-10-CM | POA: Diagnosis not present

## 2019-03-12 DIAGNOSIS — I4891 Unspecified atrial fibrillation: Secondary | ICD-10-CM | POA: Diagnosis not present

## 2019-03-12 DIAGNOSIS — I051 Rheumatic mitral insufficiency: Secondary | ICD-10-CM | POA: Diagnosis not present

## 2019-03-14 DIAGNOSIS — I110 Hypertensive heart disease with heart failure: Secondary | ICD-10-CM | POA: Diagnosis not present

## 2019-03-14 DIAGNOSIS — K219 Gastro-esophageal reflux disease without esophagitis: Secondary | ICD-10-CM | POA: Diagnosis not present

## 2019-03-14 DIAGNOSIS — J189 Pneumonia, unspecified organism: Secondary | ICD-10-CM | POA: Diagnosis not present

## 2019-03-14 DIAGNOSIS — Z9981 Dependence on supplemental oxygen: Secondary | ICD-10-CM | POA: Diagnosis not present

## 2019-03-14 DIAGNOSIS — U071 COVID-19: Secondary | ICD-10-CM | POA: Diagnosis not present

## 2019-03-14 DIAGNOSIS — M810 Age-related osteoporosis without current pathological fracture: Secondary | ICD-10-CM | POA: Diagnosis not present

## 2019-03-14 DIAGNOSIS — I4891 Unspecified atrial fibrillation: Secondary | ICD-10-CM | POA: Diagnosis not present

## 2019-03-14 DIAGNOSIS — I051 Rheumatic mitral insufficiency: Secondary | ICD-10-CM | POA: Diagnosis not present

## 2019-03-14 DIAGNOSIS — I5033 Acute on chronic diastolic (congestive) heart failure: Secondary | ICD-10-CM | POA: Diagnosis not present

## 2019-03-15 DIAGNOSIS — I10 Essential (primary) hypertension: Secondary | ICD-10-CM | POA: Diagnosis not present

## 2019-03-15 DIAGNOSIS — M1990 Unspecified osteoarthritis, unspecified site: Secondary | ICD-10-CM | POA: Diagnosis not present

## 2019-03-15 DIAGNOSIS — U071 COVID-19: Secondary | ICD-10-CM | POA: Diagnosis not present

## 2019-03-15 DIAGNOSIS — I5033 Acute on chronic diastolic (congestive) heart failure: Secondary | ICD-10-CM | POA: Diagnosis not present

## 2019-03-17 DIAGNOSIS — I051 Rheumatic mitral insufficiency: Secondary | ICD-10-CM | POA: Diagnosis not present

## 2019-03-17 DIAGNOSIS — U071 COVID-19: Secondary | ICD-10-CM | POA: Diagnosis not present

## 2019-03-17 DIAGNOSIS — Z9981 Dependence on supplemental oxygen: Secondary | ICD-10-CM | POA: Diagnosis not present

## 2019-03-17 DIAGNOSIS — J189 Pneumonia, unspecified organism: Secondary | ICD-10-CM | POA: Diagnosis not present

## 2019-03-17 DIAGNOSIS — I4891 Unspecified atrial fibrillation: Secondary | ICD-10-CM | POA: Diagnosis not present

## 2019-03-17 DIAGNOSIS — M810 Age-related osteoporosis without current pathological fracture: Secondary | ICD-10-CM | POA: Diagnosis not present

## 2019-03-17 DIAGNOSIS — K219 Gastro-esophageal reflux disease without esophagitis: Secondary | ICD-10-CM | POA: Diagnosis not present

## 2019-03-17 DIAGNOSIS — I110 Hypertensive heart disease with heart failure: Secondary | ICD-10-CM | POA: Diagnosis not present

## 2019-03-17 DIAGNOSIS — I5033 Acute on chronic diastolic (congestive) heart failure: Secondary | ICD-10-CM | POA: Diagnosis not present

## 2019-03-18 DIAGNOSIS — I4891 Unspecified atrial fibrillation: Secondary | ICD-10-CM | POA: Diagnosis not present

## 2019-03-18 DIAGNOSIS — Z9981 Dependence on supplemental oxygen: Secondary | ICD-10-CM | POA: Diagnosis not present

## 2019-03-18 DIAGNOSIS — I051 Rheumatic mitral insufficiency: Secondary | ICD-10-CM | POA: Diagnosis not present

## 2019-03-18 DIAGNOSIS — U071 COVID-19: Secondary | ICD-10-CM | POA: Diagnosis not present

## 2019-03-18 DIAGNOSIS — K219 Gastro-esophageal reflux disease without esophagitis: Secondary | ICD-10-CM | POA: Diagnosis not present

## 2019-03-18 DIAGNOSIS — J189 Pneumonia, unspecified organism: Secondary | ICD-10-CM | POA: Diagnosis not present

## 2019-03-18 DIAGNOSIS — I110 Hypertensive heart disease with heart failure: Secondary | ICD-10-CM | POA: Diagnosis not present

## 2019-03-18 DIAGNOSIS — I5033 Acute on chronic diastolic (congestive) heart failure: Secondary | ICD-10-CM | POA: Diagnosis not present

## 2019-03-18 DIAGNOSIS — M810 Age-related osteoporosis without current pathological fracture: Secondary | ICD-10-CM | POA: Diagnosis not present

## 2019-03-19 DIAGNOSIS — M810 Age-related osteoporosis without current pathological fracture: Secondary | ICD-10-CM | POA: Diagnosis not present

## 2019-03-19 DIAGNOSIS — J189 Pneumonia, unspecified organism: Secondary | ICD-10-CM | POA: Diagnosis not present

## 2019-03-19 DIAGNOSIS — Z9981 Dependence on supplemental oxygen: Secondary | ICD-10-CM | POA: Diagnosis not present

## 2019-03-19 DIAGNOSIS — I110 Hypertensive heart disease with heart failure: Secondary | ICD-10-CM | POA: Diagnosis not present

## 2019-03-19 DIAGNOSIS — I5033 Acute on chronic diastolic (congestive) heart failure: Secondary | ICD-10-CM | POA: Diagnosis not present

## 2019-03-19 DIAGNOSIS — K219 Gastro-esophageal reflux disease without esophagitis: Secondary | ICD-10-CM | POA: Diagnosis not present

## 2019-03-19 DIAGNOSIS — I051 Rheumatic mitral insufficiency: Secondary | ICD-10-CM | POA: Diagnosis not present

## 2019-03-19 DIAGNOSIS — I4891 Unspecified atrial fibrillation: Secondary | ICD-10-CM | POA: Diagnosis not present

## 2019-03-19 DIAGNOSIS — U071 COVID-19: Secondary | ICD-10-CM | POA: Diagnosis not present

## 2019-03-20 DIAGNOSIS — I4891 Unspecified atrial fibrillation: Secondary | ICD-10-CM | POA: Diagnosis not present

## 2019-03-20 DIAGNOSIS — I110 Hypertensive heart disease with heart failure: Secondary | ICD-10-CM | POA: Diagnosis not present

## 2019-03-20 DIAGNOSIS — J189 Pneumonia, unspecified organism: Secondary | ICD-10-CM | POA: Diagnosis not present

## 2019-03-20 DIAGNOSIS — K219 Gastro-esophageal reflux disease without esophagitis: Secondary | ICD-10-CM | POA: Diagnosis not present

## 2019-03-20 DIAGNOSIS — U071 COVID-19: Secondary | ICD-10-CM | POA: Diagnosis not present

## 2019-03-20 DIAGNOSIS — M810 Age-related osteoporosis without current pathological fracture: Secondary | ICD-10-CM | POA: Diagnosis not present

## 2019-03-20 DIAGNOSIS — I051 Rheumatic mitral insufficiency: Secondary | ICD-10-CM | POA: Diagnosis not present

## 2019-03-20 DIAGNOSIS — Z9981 Dependence on supplemental oxygen: Secondary | ICD-10-CM | POA: Diagnosis not present

## 2019-03-20 DIAGNOSIS — I5033 Acute on chronic diastolic (congestive) heart failure: Secondary | ICD-10-CM | POA: Diagnosis not present

## 2019-03-21 DIAGNOSIS — I4891 Unspecified atrial fibrillation: Secondary | ICD-10-CM | POA: Diagnosis not present

## 2019-03-21 DIAGNOSIS — I051 Rheumatic mitral insufficiency: Secondary | ICD-10-CM | POA: Diagnosis not present

## 2019-03-21 DIAGNOSIS — K219 Gastro-esophageal reflux disease without esophagitis: Secondary | ICD-10-CM | POA: Diagnosis not present

## 2019-03-21 DIAGNOSIS — I110 Hypertensive heart disease with heart failure: Secondary | ICD-10-CM | POA: Diagnosis not present

## 2019-03-21 DIAGNOSIS — M810 Age-related osteoporosis without current pathological fracture: Secondary | ICD-10-CM | POA: Diagnosis not present

## 2019-03-21 DIAGNOSIS — U071 COVID-19: Secondary | ICD-10-CM | POA: Diagnosis not present

## 2019-03-21 DIAGNOSIS — I5033 Acute on chronic diastolic (congestive) heart failure: Secondary | ICD-10-CM | POA: Diagnosis not present

## 2019-03-21 DIAGNOSIS — J189 Pneumonia, unspecified organism: Secondary | ICD-10-CM | POA: Diagnosis not present

## 2019-03-21 DIAGNOSIS — Z9981 Dependence on supplemental oxygen: Secondary | ICD-10-CM | POA: Diagnosis not present

## 2019-03-23 DIAGNOSIS — U071 COVID-19: Secondary | ICD-10-CM | POA: Diagnosis not present

## 2019-08-05 IMAGING — CR DG CHEST 1V PORT
1 series · 1 of 1 positions shown · non-contrast
Comparison: 03/02/2017 portable chest, and earlier.

CLINICAL DATA: [AGE] female with weakness and palpitations
since last night. History of atrial fibrillation.

EXAM:
PORTABLE CHEST 1 VIEW

[portable]
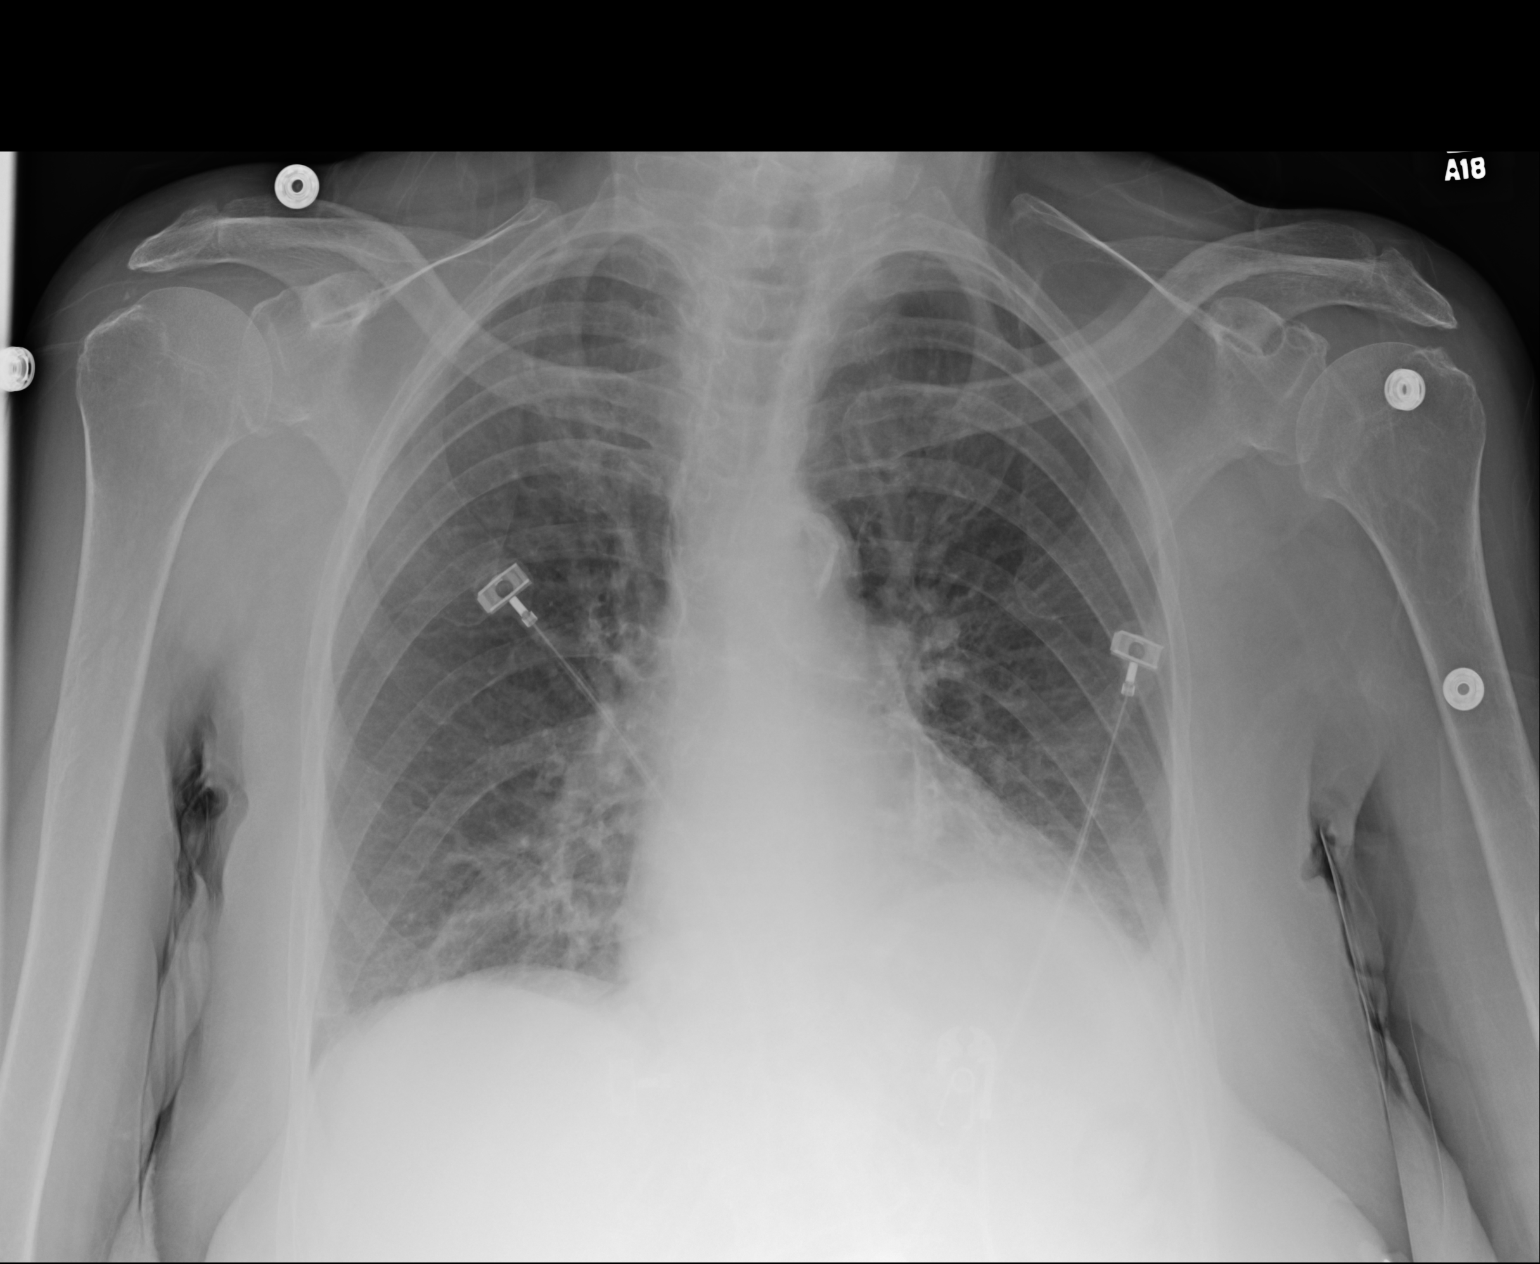

[1 of 1 positions shown; findings below may reference images not displayed]

FINDINGS: Moderately low lung volumes compared to [HOSPITAL] [facility]
chest radiographs from [facility] and earlier. Crowding of bilateral
lung markings. No pulmonary edema suspected. No pneumothorax. No
definite pleural effusion. No consolidation. Mediastinal contours
remain normal. Calcified aortic atherosclerosis. Visualized tracheal
air column is within normal limits.

Paucity of bowel gas in the upper abdomen. No acute osseous
abnormality identified.
IMPRESSION: Low lung volumes with atelectasis. No other acute cardiopulmonary
abnormality.

## 2023-11-21 DEATH — deceased
